# Patient Record
Sex: Male | Born: 1935 | Race: Black or African American | Hispanic: No | Marital: Married | Smoking: Current some day smoker
Health system: Southern US, Community
[De-identification: ages and names within clinical notes are randomized; demographics above are authoritative.]

## PROBLEM LIST (undated history)

## (undated) DIAGNOSIS — I10 Essential (primary) hypertension: Secondary | ICD-10-CM

## (undated) DIAGNOSIS — I2510 Atherosclerotic heart disease of native coronary artery without angina pectoris: Secondary | ICD-10-CM

## (undated) DIAGNOSIS — C221 Intrahepatic bile duct carcinoma: Secondary | ICD-10-CM

## (undated) DIAGNOSIS — E785 Hyperlipidemia, unspecified: Secondary | ICD-10-CM

## (undated) HISTORY — DX: Atherosclerotic heart disease of native coronary artery without angina pectoris: I25.10

## (undated) HISTORY — DX: Hyperlipidemia, unspecified: E78.5

## (undated) HISTORY — DX: Essential (primary) hypertension: I10

## (undated) HISTORY — PX: OTHER SURGICAL HISTORY: SHX169

---

## 1998-01-01 ENCOUNTER — Emergency Department (HOSPITAL_COMMUNITY): Admission: EM | Admit: 1998-01-01 | Discharge: 1998-01-01 | Payer: Self-pay | Admitting: Internal Medicine

## 1998-01-03 ENCOUNTER — Emergency Department (HOSPITAL_COMMUNITY): Admission: EM | Admit: 1998-01-03 | Discharge: 1998-01-03 | Payer: Self-pay | Admitting: Internal Medicine

## 1998-01-13 ENCOUNTER — Emergency Department (HOSPITAL_COMMUNITY): Admission: EM | Admit: 1998-01-13 | Discharge: 1998-01-13 | Payer: Self-pay | Admitting: Family Medicine

## 2001-07-21 ENCOUNTER — Encounter: Payer: Self-pay | Admitting: Emergency Medicine

## 2001-07-21 ENCOUNTER — Inpatient Hospital Stay (HOSPITAL_COMMUNITY): Admission: EM | Admit: 2001-07-21 | Discharge: 2001-07-24 | Payer: Self-pay | Admitting: Emergency Medicine

## 2001-08-10 ENCOUNTER — Encounter (HOSPITAL_COMMUNITY): Admission: RE | Admit: 2001-08-10 | Discharge: 2001-11-08 | Payer: Self-pay | Admitting: Interventional Cardiology

## 2013-04-25 ENCOUNTER — Other Ambulatory Visit: Payer: Self-pay | Admitting: *Deleted

## 2013-04-25 DIAGNOSIS — E785 Hyperlipidemia, unspecified: Secondary | ICD-10-CM

## 2013-05-11 ENCOUNTER — Encounter: Payer: Self-pay | Admitting: Cardiology

## 2013-05-11 DIAGNOSIS — E785 Hyperlipidemia, unspecified: Secondary | ICD-10-CM | POA: Insufficient documentation

## 2013-05-11 DIAGNOSIS — I2510 Atherosclerotic heart disease of native coronary artery without angina pectoris: Secondary | ICD-10-CM | POA: Insufficient documentation

## 2013-05-11 DIAGNOSIS — I10 Essential (primary) hypertension: Secondary | ICD-10-CM | POA: Insufficient documentation

## 2013-05-12 ENCOUNTER — Ambulatory Visit (INDEPENDENT_AMBULATORY_CARE_PROVIDER_SITE_OTHER): Payer: Medicare Other | Admitting: Interventional Cardiology

## 2013-05-12 ENCOUNTER — Telehealth: Payer: Self-pay

## 2013-05-12 ENCOUNTER — Encounter: Payer: Self-pay | Admitting: Interventional Cardiology

## 2013-05-12 ENCOUNTER — Other Ambulatory Visit: Payer: Medicare Other

## 2013-05-12 ENCOUNTER — Ambulatory Visit
Admission: RE | Admit: 2013-05-12 | Discharge: 2013-05-12 | Disposition: A | Discharge status: Home / Self Care | Payer: Medicare Other | Source: Ambulatory Visit | Attending: Interventional Cardiology | Admitting: Interventional Cardiology

## 2013-05-12 VITALS — BP 110/70 | HR 56 | Ht 73.0 in | Wt 167.4 lb

## 2013-05-12 DIAGNOSIS — R9389 Abnormal findings on diagnostic imaging of other specified body structures: Secondary | ICD-10-CM

## 2013-05-12 DIAGNOSIS — Z720 Tobacco use: Secondary | ICD-10-CM

## 2013-05-12 DIAGNOSIS — I2510 Atherosclerotic heart disease of native coronary artery without angina pectoris: Secondary | ICD-10-CM

## 2013-05-12 DIAGNOSIS — F17200 Nicotine dependence, unspecified, uncomplicated: Secondary | ICD-10-CM

## 2013-05-12 DIAGNOSIS — R634 Abnormal weight loss: Secondary | ICD-10-CM

## 2013-05-12 LAB — COMPREHENSIVE METABOLIC PANEL
ALT: 103 U/L — ABNORMAL HIGH (ref 0–53)
AST: 170 U/L — ABNORMAL HIGH (ref 0–37)
Albumin: 3 g/dL — ABNORMAL LOW (ref 3.5–5.2)
Alkaline Phosphatase: 591 U/L — ABNORMAL HIGH (ref 39–117)
BUN: 12 mg/dL (ref 6–23)
CO2: 30 mEq/L (ref 19–32)
Calcium: 9 mg/dL (ref 8.4–10.5)
Chloride: 101 mEq/L (ref 96–112)
Creatinine, Ser: 0.9 mg/dL (ref 0.4–1.5)
GFR: 109.72 mL/min (ref >60.00)
Glucose, Bld: 102 mg/dL — ABNORMAL HIGH (ref 70–99)
Potassium: 4.3 mEq/L (ref 3.5–5.1)
Sodium: 138 mEq/L (ref 135–145)
Total Bilirubin: 10.3 mg/dL — ABNORMAL HIGH (ref 0.3–1.2)
Total Protein: 7.7 g/dL (ref 6.0–8.3)

## 2013-05-12 LAB — CBC WITH DIFFERENTIAL/PLATELET
Basophils Absolute: 0 10*3/uL (ref 0.0–0.1)
Basophils Relative: 0.8 % (ref 0.0–3.0)
Eosinophils Absolute: 0.1 10*3/uL (ref 0.0–0.7)
Eosinophils Relative: 2.3 % (ref 0.0–5.0)
HCT: 49.1 % (ref 39.0–52.0)
Hemoglobin: 16.6 g/dL (ref 13.0–17.0)
Lymphocytes Relative: 20.2 % (ref 12.0–46.0)
Lymphs Abs: 1 10*3/uL (ref 0.7–4.0)
MCHC: 33.8 g/dL (ref 30.0–36.0)
MCV: 96.4 fl (ref 78.0–100.0)
Monocytes Absolute: 0.5 10*3/uL (ref 0.1–1.0)
Monocytes Relative: 10.1 % (ref 3.0–12.0)
Neutro Abs: 3.3 10*3/uL (ref 1.4–7.7)
Neutrophils Relative %: 66.6 % (ref 43.0–77.0)
Platelets: 216 10*3/uL (ref 150.0–400.0)
RBC: 5.1 Mil/uL (ref 4.22–5.81)
RDW: 17.3 % — ABNORMAL HIGH (ref 11.5–14.6)
WBC: 5 10*3/uL (ref 4.5–10.5)

## 2013-05-12 NOTE — Progress Notes (Signed)
Patient ID: Jason [name], male   DOB: 07/04/1934, 78 y.o.   MRN: [id number] Past Medical History  CAD with acute IMI 2003 treated with bare-metal stent RCA   Hypertension   Hyperlipidemia      [address] [place], [place]  [address] Phone: [phone] Fax:  [phone]  Date:  05/12/2013   ID:  Jason [name], DOB April 20, 1935, MRN [id number]  PCP:  No primary provider on file.   ASSESSMENT:  1. 20 pound weight loss in 12 months 2. Coronary artery disease, asymptomatic 3. Hypertension, controlled 4. Hyperlipidemia 5. Tobacco use  PLAN:  1. comprehensive metabolic panel and lipid 2. CBC 3. PA and lateral chest x-ray 4. Clinical followup in one year   SUBJECTIVE: Jason [name] is a 78 y.o. male who has no cardiovascular complaints. States his appetite is not as good as it used to be. He denied no weight loss but is obviously allergen him. He denies dyspnea. No palpitations or syncope. He denies dysphagia, hemoptysis, Hessie Diener, bright red blood per, abdominal pain, back pain, and other complaints.   Wt Readings from Last 3 Encounters:  05/12/13 167 lb 6.4 oz (75.932 kg)     Past Medical History  Diagnosis Date  . CAD (coronary artery disease)     , acute IMI  . Hypertension   . Hyperlipidemia     Current Outpatient Prescriptions  Medication Sig Dispense Refill  . aspirin 325 MG tablet Take 325 mg by mouth daily.      . nitroGLYCERIN (NITROSTAT) 0.4 MG SL tablet Place 0.4 mg under the tongue every 5 (five) minutes as needed for chest pain.      . ramipril (ALTACE) 5 MG capsule Take 5 mg by mouth daily.      . rosuvastatin (CRESTOR) 20 MG tablet Take 20 mg by mouth daily.      . sildenafil (VIAGRA) 50 MG tablet Take 50 mg by mouth daily as needed for erectile dysfunction.       No current facility-administered medications for this visit.    Allergies:   No Known Allergies  Social History:  The patient  reports that he has never smoked. He does not  have any smokeless tobacco history on file. He reports that he does not drink alcohol or use illicit drugs.   ROS:  Please see the history of present illness.   Decreased appetite. Continued cigarette smoking.   All other systems reviewed and negative.   OBJECTIVE: VS:  BP 110/70  Pulse 56  Ht 6\' 1"  (1.854 m)  Wt 167 lb 6.4 oz (75.932 kg)  BMI 22.09 kg/m2 Well nourished, well developed, in no acute distress, Malnourished appearing, and dramatic weight loss. HEENT: normal Neck: JVD flat. Carotid bruit Normal  Cardiac:  normal S1, S2; RRR; no murmur Lungs:  clear to auscultation bilaterally, no wheezing, rhonchi or rales Abd: soft, nontender, no hepatomegaly Ext: Edema none. Pulses none Skin: warm and dry Neuro:  CNs 2-12 intact, no focal abnormalities noted  EKG:  Old inferior myocardial infarction, sinus bradycardia, unchanged from prior.       Signed, Darci Needle III, MD 05/12/2013 9:44 AM

## 2013-05-12 NOTE — Telephone Encounter (Signed)
call received from Jason [name] at  [facility] regarding pt chest xray. results reviewed by Dr.Smith.pt to have Ct of the chest.pt notified of chest xray results and that pt will need a ct of the chest.pt instructed that scheduling will call to schedule ct.pt verbalized understanding

## 2013-05-12 NOTE — Patient Instructions (Signed)
Your physician recommends that you continue on your current medications as directed. Please refer to the Current Medication list given to you today.  Labs today: Lipid, CBC, Cmet  A chest x-ray takes a picture of the organs and structures inside the chest, including the heart, lungs, and blood vessels. This test can show several things, including, whether the heart is enlarges; whether fluid is building up in the lungs; and whether pacemaker / defibrillator leads are still in place.( To be done at [facility])  Your physician wants you to follow-up in: 1 year You will receive a reminder letter in the mail two months in advance. If you don't receive a letter, please call our office to schedule the follow-up appointment.

## 2013-05-13 ENCOUNTER — Telehealth: Payer: Self-pay

## 2013-05-13 NOTE — Telephone Encounter (Signed)
called pt to give appt date and time for CTA. pt sts that he already got the message appt12/18/14 @11  am no po 2 hrs prior

## 2013-05-15 ENCOUNTER — Encounter: Payer: Self-pay | Admitting: Interventional Cardiology

## 2013-05-16 ENCOUNTER — Other Ambulatory Visit: Payer: Self-pay | Admitting: Interventional Cardiology

## 2013-05-16 DIAGNOSIS — C3490 Malignant neoplasm of unspecified part of unspecified bronchus or lung: Secondary | ICD-10-CM

## 2013-05-16 DIAGNOSIS — F17200 Nicotine dependence, unspecified, uncomplicated: Secondary | ICD-10-CM

## 2013-05-19 ENCOUNTER — Ambulatory Visit (INDEPENDENT_AMBULATORY_CARE_PROVIDER_SITE_OTHER)
Admission: RE | Admit: 2013-05-19 | Discharge: 2013-05-19 | Disposition: A | Discharge status: Home / Self Care | Payer: Medicare Other | Source: Ambulatory Visit | Attending: Interventional Cardiology | Admitting: Interventional Cardiology

## 2013-05-19 ENCOUNTER — Other Ambulatory Visit: Payer: Medicare Other

## 2013-05-19 DIAGNOSIS — C801 Malignant (primary) neoplasm, unspecified: Secondary | ICD-10-CM

## 2013-05-19 DIAGNOSIS — F17200 Nicotine dependence, unspecified, uncomplicated: Secondary | ICD-10-CM

## 2013-05-19 DIAGNOSIS — C3490 Malignant neoplasm of unspecified part of unspecified bronchus or lung: Secondary | ICD-10-CM

## 2013-05-19 MED ORDER — IOHEXOL 300 MG/ML  SOLN
100.0000 mL | Freq: Once | INTRAMUSCULAR | Status: AC | PRN
  Administered 2013-05-19: 100 mL via INTRAVENOUS

## 2013-05-23 ENCOUNTER — Telehealth: Payer: Self-pay

## 2013-05-23 ENCOUNTER — Other Ambulatory Visit: Payer: Self-pay | Admitting: Interventional Cardiology

## 2013-05-23 DIAGNOSIS — C7800 Secondary malignant neoplasm of unspecified lung: Secondary | ICD-10-CM

## 2013-05-23 NOTE — Telephone Encounter (Signed)
pt given results of cta chest/abdomen by Dr.Smith.Needs an Oncology appointment ASAP with the first physician that can see him. pt will be referred to the cancer center

## 2013-05-24 ENCOUNTER — Telehealth: Payer: Self-pay | Admitting: Internal Medicine

## 2013-05-24 NOTE — Telephone Encounter (Signed)
C/D 05/24/13 for appt.05/31/13

## 2013-05-24 NOTE — Telephone Encounter (Signed)
S/W PT AND GVE NP APPT 12/30 @ 11 W/DR. MOHAMED REFERRING DR. [facility] WELCOME PACKET MAILED.

## 2013-05-31 ENCOUNTER — Ambulatory Visit (HOSPITAL_BASED_OUTPATIENT_CLINIC_OR_DEPARTMENT_OTHER): Payer: Medicare Other

## 2013-05-31 ENCOUNTER — Encounter: Payer: Self-pay | Admitting: Internal Medicine

## 2013-05-31 ENCOUNTER — Encounter (HOSPITAL_COMMUNITY): Payer: Self-pay | Admitting: Emergency Medicine

## 2013-05-31 ENCOUNTER — Inpatient Hospital Stay (HOSPITAL_COMMUNITY)
Admission: EM | Admit: 2013-05-31 | Discharge: 2013-06-03 | DRG: 843 | Disposition: A | Discharge status: Home / Self Care | Payer: Medicare Other | Attending: Internal Medicine | Admitting: Internal Medicine

## 2013-05-31 ENCOUNTER — Ambulatory Visit (HOSPITAL_BASED_OUTPATIENT_CLINIC_OR_DEPARTMENT_OTHER): Payer: Medicare Other | Admitting: Internal Medicine

## 2013-05-31 ENCOUNTER — Telehealth: Payer: Self-pay | Admitting: *Deleted

## 2013-05-31 ENCOUNTER — Ambulatory Visit: Payer: Medicare Other

## 2013-05-31 ENCOUNTER — Other Ambulatory Visit: Payer: Medicare Other

## 2013-05-31 VITALS — BP 123/81 | HR 80 | Temp 96.9°F | Resp 18 | Ht 71.0 in | Wt 168.2 lb

## 2013-05-31 DIAGNOSIS — E43 Unspecified severe protein-calorie malnutrition: Secondary | ICD-10-CM | POA: Diagnosis present

## 2013-05-31 DIAGNOSIS — C221 Intrahepatic bile duct carcinoma: Secondary | ICD-10-CM

## 2013-05-31 DIAGNOSIS — Z800 Family history of malignant neoplasm of digestive organs: Secondary | ICD-10-CM

## 2013-05-31 DIAGNOSIS — Z125 Encounter for screening for malignant neoplasm of prostate: Secondary | ICD-10-CM

## 2013-05-31 DIAGNOSIS — K769 Liver disease, unspecified: Secondary | ICD-10-CM

## 2013-05-31 DIAGNOSIS — C799 Secondary malignant neoplasm of unspecified site: Secondary | ICD-10-CM

## 2013-05-31 DIAGNOSIS — C801 Malignant (primary) neoplasm, unspecified: Principal | ICD-10-CM | POA: Diagnosis present

## 2013-05-31 DIAGNOSIS — M899 Disorder of bone, unspecified: Secondary | ICD-10-CM

## 2013-05-31 DIAGNOSIS — F17200 Nicotine dependence, unspecified, uncomplicated: Secondary | ICD-10-CM | POA: Diagnosis present

## 2013-05-31 DIAGNOSIS — Z7982 Long term (current) use of aspirin: Secondary | ICD-10-CM

## 2013-05-31 DIAGNOSIS — Z66 Do not resuscitate: Secondary | ICD-10-CM | POA: Diagnosis present

## 2013-05-31 DIAGNOSIS — C7951 Secondary malignant neoplasm of bone: Secondary | ICD-10-CM

## 2013-05-31 DIAGNOSIS — K7689 Other specified diseases of liver: Secondary | ICD-10-CM

## 2013-05-31 DIAGNOSIS — E785 Hyperlipidemia, unspecified: Secondary | ICD-10-CM | POA: Diagnosis present

## 2013-05-31 DIAGNOSIS — K831 Obstruction of bile duct: Secondary | ICD-10-CM | POA: Diagnosis present

## 2013-05-31 DIAGNOSIS — I10 Essential (primary) hypertension: Secondary | ICD-10-CM | POA: Diagnosis present

## 2013-05-31 DIAGNOSIS — I2510 Atherosclerotic heart disease of native coronary artery without angina pectoris: Secondary | ICD-10-CM | POA: Diagnosis present

## 2013-05-31 DIAGNOSIS — Q4479 Other congenital malformations of liver: Secondary | ICD-10-CM

## 2013-05-31 DIAGNOSIS — Z720 Tobacco use: Secondary | ICD-10-CM

## 2013-05-31 DIAGNOSIS — R17 Unspecified jaundice: Secondary | ICD-10-CM | POA: Diagnosis present

## 2013-05-31 DIAGNOSIS — M898X9 Other specified disorders of bone, unspecified site: Secondary | ICD-10-CM

## 2013-05-31 DIAGNOSIS — R195 Other fecal abnormalities: Secondary | ICD-10-CM | POA: Diagnosis present

## 2013-05-31 DIAGNOSIS — R634 Abnormal weight loss: Secondary | ICD-10-CM

## 2013-05-31 DIAGNOSIS — R180 Malignant ascites: Secondary | ICD-10-CM | POA: Diagnosis present

## 2013-05-31 DIAGNOSIS — Z79899 Other long term (current) drug therapy: Secondary | ICD-10-CM

## 2013-05-31 DIAGNOSIS — Z9861 Coronary angioplasty status: Secondary | ICD-10-CM

## 2013-05-31 DIAGNOSIS — Q441 Other congenital malformations of gallbladder: Secondary | ICD-10-CM

## 2013-05-31 DIAGNOSIS — R748 Abnormal levels of other serum enzymes: Secondary | ICD-10-CM | POA: Diagnosis present

## 2013-05-31 LAB — CBC WITH DIFFERENTIAL/PLATELET
BASO%: 0.8 % (ref 0.0–2.0)
Basophils Absolute: 0.1 10*3/uL (ref 0.0–0.1)
EOS%: 1.8 % (ref 0.0–7.0)
Eosinophils Absolute: 0.1 10*3/uL (ref 0.0–0.5)
HCT: 48.1 % (ref 38.4–49.9)
HGB: 16.9 g/dL (ref 13.0–17.1)
MCH: 32.4 pg (ref 27.2–33.4)
MCV: 92.3 fL (ref 79.3–98.0)
MONO%: 9.4 % (ref 0.0–14.0)
NEUT#: 4.4 10*3/uL (ref 1.5–6.5)
NEUT%: 73.1 % (ref 39.0–75.0)
RDW: 19.1 % — ABNORMAL HIGH (ref 11.0–14.6)
WBC: 6 10*3/uL (ref 4.0–10.3)
lymph#: 0.9 10*3/uL (ref 0.9–3.3)

## 2013-05-31 LAB — COMPREHENSIVE METABOLIC PANEL (CC13)
ALT: 132 U/L — ABNORMAL HIGH (ref 0–55)
AST: 231 U/L — ABNORMAL HIGH (ref 5–34)
Albumin: 2.2 g/dL — ABNORMAL LOW (ref 3.5–5.0)
Anion Gap: 14 mEq/L — ABNORMAL HIGH (ref 3–11)
BUN: 10.1 mg/dL (ref 7.0–26.0)
Calcium: 9.2 mg/dL (ref 8.4–10.4)
Chloride: 98 mEq/L (ref 98–109)
Creatinine: 0.8 mg/dL (ref 0.7–1.3)
Glucose: 106 mg/dl (ref 70–140)
Potassium: 4 mEq/L (ref 3.5–5.1)

## 2013-05-31 LAB — LACTATE DEHYDROGENASE (CC13): LDH: 320 U/L — ABNORMAL HIGH (ref 125–245)

## 2013-05-31 MED ORDER — SODIUM CHLORIDE 0.9 % IV SOLN
INTRAVENOUS | Status: DC
  Administered 2013-05-31: 20:00:00 via INTRAVENOUS
  Administered 2013-06-01: 125 mL/h via INTRAVENOUS

## 2013-05-31 NOTE — ED Notes (Signed)
Pt sts was seen today at cancer center and had blood work done and got call from Dr Arbutus Ped in the afternoon stating his lever enzymes were abnormal and that he needs to be seen in ED. Pt denies pain

## 2013-05-31 NOTE — Telephone Encounter (Signed)
appts made and printed...td 

## 2013-05-31 NOTE — Patient Instructions (Signed)
Referral to gastroenterology. Followup visit in 2 weeks

## 2013-05-31 NOTE — ED Provider Notes (Addendum)
CSN: [id number]     Arrival date & time 05/31/13  1753 History   First MD Initiated Contact with Patient 05/31/13 1840     Chief Complaint  Patient presents with  . Abnormal Lab   History is obtained through the patient and review of the medical record HPI Patient states he was sent to the emergency room because Dr. Shirline Frees and told him he needed to be admitted.  Patient has had trouble with decreased appetite and weight loss over the last several weeks. He had some blood tests done at his cardiologist's office. The patient does not really know the results of those blood tests and was not sure why he he was sent to Dr. Rebecca Eaton office.  He denies any trouble with chest pain or shortness of breath. He denies any trouble with fever vomiting or diarrhea.  He has noticed some mild itching.  Per Dr Rebecca Eaton notes "Jason [name] is a 78 y.o. male with a past medical history significant for hypertension, dyslipidemia, coronary artery disease status post stent placement as well as long history of smoking. The patient was seen recently by his cardiologist Dr. Katrinka Blazing for routine evaluation. He complains of lack of appetite and significant weight loss. CBC and comprehensive metabolic panel performed in his office on 05/12/2013 showed elevated liver enzyme with total bilirubin of 10.3, alkaline phosphatase 591, AST 170 and ALT 103. CT scan of the chest, abdomen and pelvis performed on 05/23/2013 showed right upper lobe scar like opacity containing a few punctate calcification measuring 5.8 x 1.3 x 1.5 CM. There is a left upper lobe anterior subpleural spiculated density likely representing scar. CT of the abdomen showed no focal liver abnormality and there was mild to moderate intrahepatic bile ductal dilatation with no common bile duct dilatation. There is no obstructing mass noted. The gallbladder appears collapsed and there was normal appearance of the pancreas. Further evaluation with ERCP of your MRCP was  recommended. There was also diffuse multifocal sclerotic bone lesions identified worrisome for metastatic disease.  The patient was not referred to gastroenterology but Dr. Katrinka Blazing referred him to the cancer Center for evaluation of his condition.  The initial plan was to see the patient back for followup visit in less than 2 weeks for evaluation and discussion of his treatment options based on the ERCP findings, but repeat CBC today showed significant deterioration of his hepatic function with increase in the total bilirubin to 21.13 and alkaline phosphatase up to 866, AST 31 and ALT 132.  I told the patient that time and recommended for him to go immediately to the emergency Department at [facility] for consideration of admission and initiating the gastrointestinal workup on an inpatient basis.  Past Medical History  Diagnosis Date  . CAD (coronary artery disease)     , acute IMI  . Hypertension   . Hyperlipidemia    History reviewed. No pertinent past surgical history. No family history on file. History  Substance Use Topics  . Smoking status: Current Some Day Smoker  . Smokeless tobacco: Not on file  . Alcohol Use: No    Review of Systems  All other systems reviewed and are negative.    Allergies  Review of patient's allergies indicates no known allergies.  Home Medications   Current Outpatient Rx  Name  Route  Sig  Dispense  Refill  . aspirin 325 MG tablet   Oral   Take 325 mg by mouth daily.         Marland Kitchen  latanoprost (XALATAN) 0.005 % ophthalmic solution   Both Eyes   Place 1 drop into both eyes at bedtime.          . nitroGLYCERIN (NITROSTAT) 0.4 MG SL tablet   Sublingual   Place 0.4 mg under the tongue every 5 (five) minutes as needed for chest pain.         . ramipril (ALTACE) 5 MG capsule   Oral   Take 5 mg by mouth daily.         . rosuvastatin (CRESTOR) 20 MG tablet   Oral   Take 20 mg by mouth daily.         . sildenafil (VIAGRA) 50 MG  tablet   Oral   Take 50 mg by mouth daily as needed for erectile dysfunction.         . timolol (TIMOPTIC) 0.5 % ophthalmic solution   Both Eyes   Place 1 drop into both eyes daily.           BP 124/76  Pulse 103  Temp(Src) 97.1 F (36.2 C) (Oral)  Resp 18  SpO2 95% Physical Exam  Nursing note and vitals reviewed. Constitutional: No distress.  HENT:  Head: Normocephalic and atraumatic.  Right Ear: External ear normal.  Left Ear: External ear normal.  Mouth/Throat: No oropharyngeal exudate.  Eyes: Conjunctivae are normal. Right eye exhibits no discharge. Left eye exhibits no discharge. Scleral icterus is present.  Neck: Neck supple. No tracheal deviation present.  Cardiovascular: Normal rate, regular rhythm and intact distal pulses.   Pulmonary/Chest: Effort normal and breath sounds normal. No stridor. No respiratory distress. He has no wheezes. He has no rales.  Smells of cigarette smoke   Abdominal: Soft. Bowel sounds are normal. He exhibits no distension. There is no tenderness. There is no rebound and no guarding.  Musculoskeletal: He exhibits no edema and no tenderness.  Neurological: He is alert. He has normal strength. No sensory deficit. Cranial nerve deficit:  no gross defecits noted. He exhibits normal muscle tone. He displays no seizure activity. Coordination normal.  Skin: Skin is warm and dry. No rash noted. He is not diaphoretic.  Psychiatric: He has a normal mood and affect.    ED Course  Procedures (including critical care time) Labs Review Labs Reviewed - No data to display Imaging Review No results found.  EKG Interpretation   None       MDM   1. Hyperbilirubinemia   2. Weight loss   3. Malignancy     [name] symptoms are concerning for some type of malignancy. He does have increasing hyperbilirubinemia with dilated biliary ducts. Patient is medically stable emergency room. I will consult the medical service and the gastroenterology  service to arrange for admission as requested by Dr. Burman Riis, MD 05/31/13 [phone]  Discussed with Dr Marina Goodell.  GI team will consult on patient.  Probably would benefit from MRCP.  Celene Kras, MD 05/31/13 [phone]

## 2013-05-31 NOTE — ED Notes (Signed)
Pt denies n/v/d, fever or itching. Pt's eyes yellow

## 2013-05-31 NOTE — H&P (Signed)
[facility] History and Physical  Jason [name] ZOX:[id number] DOB: 1935/01/25 DOA: 05/31/2013  Referring physician: Linwood Dibbles, MD PCP: [facility]  Chief Complaint: Cough, weight loss, and jaundice  HPI: Jason [name] is a 78 y.o. male   The patient has medical problems that include hypertension, dyslipidemia on statin, coronary artery disease status post PCI, and tobacco abuse.  He describes symptoms that include weight loss over approximately 3 years, approximately 20-40 pounds but difficult to quantify, he says.  He initially intended to lose some extra weight by limiting his portions but then continued to lose weight unintentionally.  He does complain of lack of appetite and some decrease in PO intake.  He endorses darkened urine, which now appears cola colored.  Additionally, he endorses some lightening/acholic stools over several months. The patient denies fevers or chills.  He has a chronic cough which is productive of scant mucoid sputum.  He describes his functional capacity as good, being able to walk 1 mile without fatiguing.  He lives at home with his wife and an older family member.  The patient was recently seen by Dr. Katrinka Blazing for general evaluation.  Presumably after that visit it was discovered that he had hyperbilirubinemia to 10.3, ALP 591, AST 170, and ALT 103.  He underwent CT scan of the chest that revealed RUL scar-like opacitifcation 5.8 x 1.3 x 1.5 cm.  There was mild-mod intrahepatic biliary ductal diltation but no CBD dil.  Additionally there was mention of bony lesions possibly consistent with metastatic disease.  He was evaluated by Dr. Si Gaul   Review of Systems:  Constitutional:  No weight loss, night sweats, Fevers, chills, fatigue.  He does endorse being hot at night which is no different from baseline. HEENT:  No headaches, Difficulty swallowing,Tooth/dental problems,Sore throat,  No sneezing, itching, ear ache, nasal congestion,  post nasal drip,  Cardio-vascular:  No chest pain, Orthopnea, PND, swelling in lower extremities, anasarca, dizziness, palpitations  GI:  No heartburn, indigestion, abdominal pain, nausea, vomiting, diarrhea,  Endorses loss of appetite, acholic stools, darkened urine Resp:  No shortness of breath with exertion or at rest. No excess mucus, No coughing up of blood.No change in color of mucus.No wheezing.No chest wall deformity  +Cough prod mucoid sputum, no change Skin:  no rash or lesions.  GU:  no dysuria,  no urgency or frequency. No flank pain.  +Darkened urine Musculoskeletal:  No joint pain or swelling. No decreased range of motion. No back pain.  Psych:  No change in mood or affect. No depression or anxiety. No memory loss.  The remainder of a complete review of systems was reviewed and was negative.  Past Medical History  Diagnosis Date  . CAD (coronary artery disease)     , acute IMI  . Hypertension   . Hyperlipidemia    History reviewed. No pertinent past surgical history. Social History:  reports that he has been smoking Cigarettes.  He has a 30 pack-year smoking history. He does not have any smokeless tobacco history on file. He reports that he does not drink alcohol or use illicit drugs.  Endorses smoking 1 pack per 2 days; started smoking aged 78 or so which leads to approx 30 pack-yr history  No Known Allergies  Family History  Problem Relation Age of Onset  . Stomach cancer Brother     Prior to Admission medications   Medication Sig Start Date End Date Taking? Authorizing Provider  aspirin 325 MG tablet  Take 325 mg by mouth daily.   Yes Historical Provider, MD  latanoprost (XALATAN) 0.005 % ophthalmic solution Place 1 drop into both eyes at bedtime.  05/23/13  Yes Historical Provider, MD  nitroGLYCERIN (NITROSTAT) 0.4 MG SL tablet Place 0.4 mg under the tongue every 5 (five) minutes as needed for chest pain.   Yes Historical Provider, MD  ramipril (ALTACE) 5  MG capsule Take 5 mg by mouth daily.   Yes Historical Provider, MD  rosuvastatin (CRESTOR) 20 MG tablet Take 20 mg by mouth daily.   Yes Historical Provider, MD  sildenafil (VIAGRA) 50 MG tablet Take 50 mg by mouth daily as needed for erectile dysfunction.   Yes Historical Provider, MD  timolol (TIMOPTIC) 0.5 % ophthalmic solution Place 1 drop into both eyes daily.  05/23/13  Yes Historical Provider, MD   Physical Exam: Filed Vitals:   05/31/13 1810  BP: 124/76  Pulse: 103  Temp: 97.1 F (36.2 C)  Resp: 18    BP 124/76  Pulse 103  Temp(Src) 97.1 F (36.2 C) (Oral)  Resp 18  SpO2 95%  BP 124/76  Pulse 103  Temp(Src) 97.1 F (36.2 C) (Oral)  Resp 18  SpO2 95%  General Appearance:    Alert, cooperative, no distress, appears stated age  Head:    Normocephalic, without obvious abnormality, atraumatic  Eyes:    PERRL, conjunctiva/corneas clear, EOM's intact, fundi    benign, both eyes  ; prominent scleral icterus   Nose:   Nares normal, septum midline, mucosa normal, no drainage    or sinus tenderness  Throat:   Lips, mucosa, and tongue normal; teeth and gums normal; +palatal icterus  Neck:   Supple, symmetrical, trachea midline, no adenopathy;       thyroid:  No enlargement/tenderness/nodules; no carotid   bruit or JVD  Back:     Symmetric, no curvature, ROM normal, no CVA tenderness  Lungs:     Clear to auscultation bilaterally except for some expiratory monophonic wheezing over the RUL, respirations unlabored  Chest wall:    No tenderness or deformity  Heart:    Regular rate and rhythm, S1 and S2 normal, no murmur, rub   or gallop  Abdomen:     Soft, non-tender, bowel sounds active    no masses, +hepatic enlargement, liver span about 18 cm vertically  Extremities:   Extremities normal, atraumatic, no cyanosis or edema  Pulses:   2+ and symmetric all extremities  Skin:   Skin color jaundiced, texture, turgor normal, no rashes or lesions  Lymph nodes:   Cervical,  supraclavicular, and axillary nodes normal            Labs on Admission:  Basic Metabolic Panel:  Recent Labs Lab 05/31/13 1306  NA 136  K 4.0  CO2 24  GLUCOSE 106  BUN 10.1  CREATININE 0.8  CALCIUM 9.2   Liver Function Tests:  Recent Labs Lab 05/31/13 1306  AST 231*  ALT 132*  ALKPHOS 866*  BILITOT 21.13*  PROT 7.6  ALBUMIN 2.2*   No results found for this basename: LIPASE, AMYLASE,  in the last 168 hours No results found for this basename: AMMONIA,  in the last 168 hours CBC:  Recent Labs Lab 05/31/13 1305  WBC 6.0  NEUTROABS 4.4  HGB 16.9  HCT 48.1  MCV 92.3  PLT 188   Cardiac Enzymes: No results found for this basename: CKTOTAL, CKMB, CKMBINDEX, TROPONINI,  in the last 168 hours  BNP (last  3 results) No results found for this basename: PROBNP,  in the last 8760 hours CBG: No results found for this basename: GLUCAP,  in the last 168 hours  Radiological Exams on Admission: No results found.  Assessment/Plan Active Problems:   Hyperbilirubinemia   1. Hyperbilirubinemia, obstructive jaundice.  Given the CT findings of intrahepatic biliary ductal dilatation, although there is no biliary outflow obstruction definitely identified by CT, with RUL mass, there is concern for lung CA which is metastatic.  Of course, hepatobiliary primary could be considered such as cholangioCA, Kl[place] tumor, gall bladder carcinoma, etc.   The patient will need GI consultation in the AM.  I will order MRCP to further evaluate. 2. RUL lung mass.  CT scan shows this mass to be a "nonspecific area of scar-like consolidation" which is thought to be sequelae of chronic inflammation/infection but given this patient's smoking history, a primary lung CA is also considered.  The patient may need biopsy of this mass, pending workup of biliary outflow obstruction. 3. Hypertension: continue ACE inhibitor at home dose 4. Dyslipidemia and coronary artery disease: substitute formulary statin  atorva 40 mg daily 5. Tobacco abuse plays into diagnostic considerations above; counseled in cessation.  Code Status: DNR: patient with decision making capacity declines resuscitation if he experiences CP arrest Family Communication: Jason [name], wife at bedside, discussed plan of care and questions were answered, per chart [phone] Disposition Plan: inpatient 3-4 days Time spent: 55 minutes  Maxie Barb [facility] Pager [phone]

## 2013-05-31 NOTE — Progress Notes (Signed)
[facility] [phone]   [phone]  CONSULT NOTE  REFERRING PHYSICIAN: Dr. Verdis Prime  REASON FOR CONSULTATION:  78 years old African American male with questionable metastatic cancer  HPI Jason [name] is a 78 y.o. male with a past medical history significant for hypertension, dyslipidemia, coronary artery disease status post stent placement as well as long history of smoking. The patient was seen recently by his cardiologist Dr. Katrinka Blazing for routine evaluation. He complains of lack of appetite and significant weight loss. CBC and comprehensive metabolic panel performed in his office on 05/12/2013 showed elevated liver enzyme with total bilirubin of 10.3, alkaline phosphatase 591, AST 170 and ALT 103. CT scan of the chest, abdomen and pelvis performed on 05/23/2013 showed right upper lobe scar like opacity containing a few punctate calcification measuring 5.8 x 1.3 x 1.5 CM. There is a left upper lobe anterior subpleural spiculated density likely representing scar. CT of the abdomen showed no focal liver abnormality and there was mild to moderate intrahepatic bile ductal dilatation with no common bile duct dilatation. There is no obstructing mass noted. The gallbladder appears collapsed and there was normal appearance of the pancreas. Further evaluation with ERCP of your MRCP was recommended. There was also diffuse multifocal sclerotic bone lesions identified worrisome for metastatic disease. The patient was not referred to gastroenterology but Dr. Katrinka Blazing referred him to the cancer Center for evaluation of his condition. When seen today the patient is feeling fine with no specific complaints. He denied having any significant nausea or vomiting, no abdominal pain, no change in the bowel movement. He denied having any significant chest pain, shortness breath, cough or hemoptysis. He lost around 20 pounds over the last 3 years. The patient looked jaundice but he did  not notice the change in his eye color. He denied having any significant headache or blurry vision. His family history significant for father with ERCP and brother with stomach cancer. The patient is married and has 2 children. He is currently retired and did a lot of of maintenance work. He has a history of smoking around one pack per day for 61 years and unfortunately he continues to smoke. He drinks alcohol occasionally and no history of drug abuse.  HPI  Past Medical History  Diagnosis Date  . CAD (coronary artery disease)     , acute IMI  . Hypertension   . Hyperlipidemia     History reviewed. No pertinent past surgical history.  History reviewed. No pertinent family history.  Social History History  Substance Use Topics  . Smoking status: Current Some Day Smoker  . Smokeless tobacco: Not on file  . Alcohol Use: No    No Known Allergies  Current Outpatient Prescriptions  Medication Sig Dispense Refill  . aspirin 325 MG tablet Take 325 mg by mouth daily.      Marland Kitchen latanoprost (XALATAN) 0.005 % ophthalmic solution       . nitroGLYCERIN (NITROSTAT) 0.4 MG SL tablet Place 0.4 mg under the tongue every 5 (five) minutes as needed for chest pain.      . ramipril (ALTACE) 5 MG capsule Take 5 mg by mouth daily.      . rosuvastatin (CRESTOR) 20 MG tablet Take 20 mg by mouth daily.      . sildenafil (VIAGRA) 50 MG tablet Take 50 mg by mouth daily as needed for erectile dysfunction.      . timolol (TIMOPTIC) 0.5 % ophthalmic solution  No current facility-administered medications for this visit.    Review of Systems  Constitutional: negative Eyes: positive for Jaundiced Ears, nose, mouth, throat, and face: negative Respiratory: negative Cardiovascular: negative Gastrointestinal: negative Genitourinary:negative Integument/breast: negative Hematologic/lymphatic: negative Musculoskeletal:negative Neurological: negative Behavioral/Psych: negative Endocrine:  negative Allergic/Immunologic: negative  Physical Exam  GNF:AOZHY, healthy, no distress, well nourished and well developed SKIN: skin color, texture, turgor are normal, no rashes or significant lesions HEAD: Normocephalic, No masses, lesions, tenderness or abnormalities EYES: Icteric sclera EARS: External ears normal, Canals clear OROPHARYNX:no exudate, no erythema and lips, buccal mucosa, and tongue normal  NECK: supple, no adenopathy, no JVD LYMPH:  no palpable lymphadenopathy, no hepatosplenomegaly LUNGS: clear to auscultation , and palpation HEART: regular rate & rhythm, no murmurs and no gallops ABDOMEN:abdomen soft, non-tender, normal bowel sounds and no masses or organomegaly BACK: Back symmetric, no curvature., No CVA tenderness EXTREMITIES:no joint deformities, effusion, or inflammation, no edema, no skin discoloration, no clubbing  NEURO: alert & oriented x 3 with fluent speech, no focal motor/sensory deficits  PERFORMANCE STATUS: ECOG 1  LABORATORY DATA: Lab Results  Component Value Date   WBC 5.0 05/12/2013   HGB 16.6 05/12/2013   HCT 49.1 05/12/2013   MCV 96.4 05/12/2013   PLT 216.0 05/12/2013      Chemistry      Component Value Date/Time   NA 138 05/12/2013 1004   K 4.3 05/12/2013 1004   CL 101 05/12/2013 1004   CO2 30 05/12/2013 1004   BUN 12 05/12/2013 1004   CREATININE 0.9 05/12/2013 1004      Component Value Date/Time   CALCIUM 9.0 05/12/2013 1004   ALKPHOS 591* 05/12/2013 1004   AST 170* 05/12/2013 1004   ALT 103* 05/12/2013 1004   BILITOT 10.3* 05/12/2013 1004       RADIOGRAPHIC STUDIES: Dg Chest 2 View  05/12/2013   CLINICAL DATA:  Tobacco use and hypertension.  EXAM: CHEST  2 VIEW  COMPARISON:  None.  FINDINGS: The cardiomediastinal silhouette is within normal limits. The lungs are mildly hyperinflated. Interstitial markings are mildly prominent diffusely. There is an approximately 1 cm elongated focal opacity in the right upper lobe.  Patch, somewhat ill-defined opacities are also present in the left upper lobe and right lower lobe. There is no evidence of pleural effusion or pneumothorax. Multiple old left-sided rib fractures are present.  IMPRESSION: Hyperinflation with scattered, ill-defined patchy opacities in both upper lobes and right lower lobe. While these could reflect the sequelae of current or past infection, given patient's smoking history underlying malignancy is not excluded. If available, comparison with any prior chest radiographs would be helpful to assess stability. Otherwise, consider chest CT for further evaluation.   Electronically Signed   By: Sebastian Ache   On: 05/12/2013 14:51   Ct Chest W Contrast  05/19/2013   CLINICAL DATA:  Evaluate for metastatic disease.  EXAM: CT CHEST, ABDOMEN, AND PELVIS WITH CONTRAST  TECHNIQUE: Multidetector CT imaging of the chest, abdomen and pelvis was performed following the standard protocol during bolus administration of intravenous contrast.  CONTRAST:  OMNIPAQUE IOHEXOL 300 MG/ML  SOLN  COMPARISON:  None.  FINDINGS: CT CHEST FINDINGS  No pleural effusion identified. Advanced changes of centrilobular and paraseptal emphysema identified. Right upper lobe scar like opacity containing a few punctate calcifications is noted measuring 5.8 x 1.3 by 1.5 cm, image 18/series 4. There is a left upper lobe anterior subpleural spiculated density which likely represents scar, image 15/ series 4.  The trachea os stress set the trachea appears patent and is midline.  The heart size is normal.  No pericardial effusion.  There is no enlarged mediastinal or hilar lymph nodes identified.  Coronary artery calcifications are identified including RCA, LAD and left circumflex arteries. Mild calcified atherosclerotic change also involves the thoracic aorta. No enlarged axillary or supraclavicular adenopathy.  Review of the visualized osseous structures demonstrates innumerable sclerotic lesions  throughout the bony thorax. Most of these measure less than 1 cm and are worrisome for bone metastases.  CT ABDOMEN AND PELVIS FINDINGS  There is no focal liver abnormality. There is a mild to moderate intrahepatic bile duct dilatation. No common bile duct dilatation. No obstructing mass noted. Gallbladder appears collapsed. Common bile duct has a normal caliber. Normal appearance of the pancreas. The spleen is negative.  The adrenal glands both appear normal. Both kidneys are on unremarkable.  The urinary bladder appears normal. Prostate gland is enlarged and has a transverse dimension of 5.9 cm.  Normal caliber of the abdominal aorta. There is no upper abdominal adenopathy noted. No pelvic or inguinal adenopathy noted. Moderate ascites is identified within the abdomen and pelvis.  Review of the visualized osseous structures is significant for diffuse sclerotic bone metastasis. No pathologic fractures identified.  IMPRESSION: CT chest:  1. Nonspecific area of scar-like consolidation is noted in the right upper lobe. This is favored to be the sequela of chronic infection or inflammation. 2. Diffuse multifocal sclerotic bone lesions are identified worrisome for metastatic disease. CT abdomen and pelvis:  1. Intrahepatic bile duct dilatation with normal caliber common bile duct. In a patient who has elevated liver function tests I cannot exclude centrally obstructing lesion. Consider further evaluation with ERCP or MRCP. 2. Abdominal and pelvic ascites 3. Diffuse sclerotic bone lesions identified worrisome for widespread metastatic disease.   Electronically Signed   By: Signa Kell M.D.   On: 05/19/2013 14:11   Ct Abdomen Pelvis W Contrast  05/19/2013   CLINICAL DATA:  Evaluate for metastatic disease.  EXAM: CT CHEST, ABDOMEN, AND PELVIS WITH CONTRAST  TECHNIQUE: Multidetector CT imaging of the chest, abdomen and pelvis was performed following the standard protocol during bolus administration of intravenous  contrast.  CONTRAST:  OMNIPAQUE IOHEXOL 300 MG/ML  SOLN  COMPARISON:  None.  FINDINGS: CT CHEST FINDINGS  No pleural effusion identified. Advanced changes of centrilobular and paraseptal emphysema identified. Right upper lobe scar like opacity containing a few punctate calcifications is noted measuring 5.8 x 1.3 by 1.5 cm, image 18/series 4. There is a left upper lobe anterior subpleural spiculated density which likely represents scar, image 15/ series 4.  The trachea os stress set the trachea appears patent and is midline.  The heart size is normal.  No pericardial effusion.  There is no enlarged mediastinal or hilar lymph nodes identified.  Coronary artery calcifications are identified including RCA, LAD and left circumflex arteries. Mild calcified atherosclerotic change also involves the thoracic aorta. No enlarged axillary or supraclavicular adenopathy.  Review of the visualized osseous structures demonstrates innumerable sclerotic lesions throughout the bony thorax. Most of these measure less than 1 cm and are worrisome for bone metastases.  CT ABDOMEN AND PELVIS FINDINGS  There is no focal liver abnormality. There is a mild to moderate intrahepatic bile duct dilatation. No common bile duct dilatation. No obstructing mass noted. Gallbladder appears collapsed. Common bile duct has a normal caliber. Normal appearance of the pancreas. The spleen is negative.  The adrenal  glands both appear normal. Both kidneys are on unremarkable.  The urinary bladder appears normal. Prostate gland is enlarged and has a transverse dimension of 5.9 cm.  Normal caliber of the abdominal aorta. There is no upper abdominal adenopathy noted. No pelvic or inguinal adenopathy noted. Moderate ascites is identified within the abdomen and pelvis.  Review of the visualized osseous structures is significant for diffuse sclerotic bone metastasis. No pathologic fractures identified.  IMPRESSION: CT chest:  1. Nonspecific area of scar-like  consolidation is noted in the right upper lobe. This is favored to be the sequela of chronic infection or inflammation. 2. Diffuse multifocal sclerotic bone lesions are identified worrisome for metastatic disease. CT abdomen and pelvis:  1. Intrahepatic bile duct dilatation with normal caliber common bile duct. In a patient who has elevated liver function tests I cannot exclude centrally obstructing lesion. Consider further evaluation with ERCP or MRCP. 2. Abdominal and pelvic ascites 3. Diffuse sclerotic bone lesions identified worrisome for widespread metastatic disease.   Electronically Signed   By: Signa Kell M.D.   On: 05/19/2013 14:11    ASSESSMENT: This is a very pleasant 78 years old [place] American male with significantly elevated liver enzyme questionable for biliary obstruction of unknown etiology but could be suspicious for cholangiocarcinoma or other hepatobiliary malignancy. The patient also has diffuse sclerotic bone lesions suspicious for widespread metastases.   PLAN: I have a lengthy discussion with the patient today about his condition. I ordered several studies today to evaluate his condition including repeat CBC, comprehensive metabolic panel, LDH, alpha-fetoprotein, CA 19-9. I also made the start referral to gastroenterology for evaluation of his condition and consideration of ERCP. The initial plan was to see the patient back for followup visit in less than 2 weeks for evaluation and discussion of his treatment options based on the ERCP findings, but repeat CBC today showed significant deterioration of his hepatic function with increase in the total bilirubin to 21.13 and alkaline phosphatase up to 866, AST 31 and ALT 132. I told the patient that time and recommended for him to go immediately to the emergency Department at [facility] for consideration of admission and initiating the gastrointestinal workup on an inpatient basis. I will follow the patient on during his  hospitalization and give recommendation as needed. The patient agreed to the current plan. The patient voices understanding of current disease status and treatment options and is in agreement with the current care plan.  All questions were answered. The patient knows to call the clinic with any problems, questions or concerns. We can certainly see the patient much sooner if necessary.  Thank you so much for allowing me to participate in the care of Jason [name]. I will continue to follow up the patient with you and assist in his care.  I spent 40 minutes counseling the patient face to face. The total time spent in the appointment was 60 minutes.  Cyndie Woodbeck K. 05/31/2013, 12:38 PM

## 2013-06-01 ENCOUNTER — Inpatient Hospital Stay (HOSPITAL_COMMUNITY): Payer: Medicare Other

## 2013-06-01 ENCOUNTER — Inpatient Hospital Stay (HOSPITAL_COMMUNITY)
Admit: 2013-06-01 | Discharge: 2013-06-01 | Disposition: A | Discharge status: Home / Self Care | Payer: Medicare Other | Attending: Internal Medicine | Admitting: Internal Medicine

## 2013-06-01 ENCOUNTER — Encounter (HOSPITAL_COMMUNITY): Payer: Self-pay | Admitting: Radiology

## 2013-06-01 DIAGNOSIS — E785 Hyperlipidemia, unspecified: Secondary | ICD-10-CM

## 2013-06-01 DIAGNOSIS — C801 Malignant (primary) neoplasm, unspecified: Principal | ICD-10-CM | POA: Diagnosis present

## 2013-06-01 DIAGNOSIS — E43 Unspecified severe protein-calorie malnutrition: Secondary | ICD-10-CM

## 2013-06-01 DIAGNOSIS — I10 Essential (primary) hypertension: Secondary | ICD-10-CM

## 2013-06-01 DIAGNOSIS — R748 Abnormal levels of other serum enzymes: Secondary | ICD-10-CM

## 2013-06-01 DIAGNOSIS — R17 Unspecified jaundice: Secondary | ICD-10-CM

## 2013-06-01 DIAGNOSIS — K831 Obstruction of bile duct: Secondary | ICD-10-CM

## 2013-06-01 LAB — CBC
HCT: 41.9 % (ref 39.0–52.0)
Hemoglobin: 14.8 g/dL (ref 13.0–17.0)
MCH: 32.7 pg (ref 26.0–34.0)
Platelets: 189 10*3/uL (ref 150–400)
RBC: 4.52 MIL/uL (ref 4.22–5.81)
WBC: 6.2 10*3/uL (ref 4.0–10.5)

## 2013-06-01 LAB — LIPASE, BLOOD: Lipase: 36 U/L (ref 11–59)

## 2013-06-01 LAB — COMPREHENSIVE METABOLIC PANEL
ALT: 105 U/L — ABNORMAL HIGH (ref 0–53)
AST: 196 U/L — ABNORMAL HIGH (ref 0–37)
Albumin: 1.9 g/dL — ABNORMAL LOW (ref 3.5–5.2)
Alkaline Phosphatase: 702 U/L — ABNORMAL HIGH (ref 39–117)
BUN: 10 mg/dL (ref 6–23)
Calcium: 8.5 mg/dL (ref 8.4–10.5)
GFR calc Af Amer: >90 mL/min (ref >90)
Potassium: 3.6 mEq/L — ABNORMAL LOW (ref 3.7–5.3)
Sodium: 136 mEq/L — ABNORMAL LOW (ref 137–147)
Total Protein: 5.9 g/dL — ABNORMAL LOW (ref 6.0–8.3)

## 2013-06-01 LAB — PROTIME-INR: Prothrombin Time: 13.9 seconds (ref 11.6–15.2)

## 2013-06-01 LAB — AFP TUMOR MARKER: AFP-Tumor Marker: 3.2 ng/mL (ref 0.0–8.0)

## 2013-06-01 LAB — CANCER ANTIGEN 19-9: CA 19-9: 28035.3 U/mL — ABNORMAL HIGH (ref <35.0)

## 2013-06-01 MED ORDER — ATORVASTATIN CALCIUM 40 MG PO TABS
40.0000 mg | ORAL_TABLET | Freq: Every day | ORAL | Status: DC
  Administered 2013-06-01 – 2013-06-02 (×2): 40 mg via ORAL
  Filled 2013-06-01 (×4): qty 1

## 2013-06-01 MED ORDER — ACETAMINOPHEN 325 MG PO TABS: 650.0000 mg | ORAL_TABLET | Freq: Four times a day (QID) | ORAL | Status: DC | PRN

## 2013-06-01 MED ORDER — LATANOPROST 0.005 % OP SOLN
1.0000 [drp] | Freq: Every day | OPHTHALMIC | Status: DC
  Administered 2013-06-01 – 2013-06-02 (×3): 1 [drp] via OPHTHALMIC
  Filled 2013-06-01 (×2): qty 2.5

## 2013-06-01 MED ORDER — TIMOLOL MALEATE 0.5 % OP SOLN
1.0000 [drp] | Freq: Every day | OPHTHALMIC | Status: DC
  Administered 2013-06-01 – 2013-06-03 (×3): 1 [drp] via OPHTHALMIC
  Filled 2013-06-01: qty 5

## 2013-06-01 MED ORDER — SODIUM CHLORIDE 0.9 % IV SOLN
INTRAVENOUS | Status: AC
  Administered 2013-06-01: 02:00:00 via INTRAVENOUS

## 2013-06-01 MED ORDER — ENOXAPARIN SODIUM 40 MG/0.4ML ~~LOC~~ SOLN
40.0000 mg | Freq: Every day | SUBCUTANEOUS | Status: DC
  Administered 2013-06-01 – 2013-06-02 (×3): 40 mg via SUBCUTANEOUS
  Filled 2013-06-01 (×5): qty 0.4

## 2013-06-01 MED ORDER — ASPIRIN 325 MG PO TABS
325.0000 mg | ORAL_TABLET | Freq: Every day | ORAL | Status: DC
  Administered 2013-06-01 – 2013-06-03 (×3): 325 mg via ORAL
  Filled 2013-06-01 (×4): qty 1

## 2013-06-01 MED ORDER — ONDANSETRON HCL 4 MG/2ML IJ SOLN: 4.0000 mg | Freq: Four times a day (QID) | INTRAMUSCULAR | Status: DC | PRN

## 2013-06-01 MED ORDER — ACETAMINOPHEN 650 MG RE SUPP: 650.0000 mg | Freq: Four times a day (QID) | RECTAL | Status: DC | PRN

## 2013-06-01 MED ORDER — NITROGLYCERIN 0.4 MG SL SUBL: 0.4000 mg | SUBLINGUAL_TABLET | SUBLINGUAL | Status: DC | PRN

## 2013-06-01 MED ORDER — ONDANSETRON HCL 4 MG PO TABS: 4.0000 mg | ORAL_TABLET | Freq: Four times a day (QID) | ORAL | Status: DC | PRN

## 2013-06-01 MED ORDER — GADOBENATE DIMEGLUMINE 529 MG/ML IV SOLN
15.0000 mL | Freq: Once | INTRAVENOUS | Status: AC | PRN
  Administered 2013-06-01: 15 mL via INTRAVENOUS

## 2013-06-01 MED ORDER — RAMIPRIL 5 MG PO CAPS
5.0000 mg | ORAL_CAPSULE | Freq: Every day | ORAL | Status: DC
  Administered 2013-06-01 – 2013-06-03 (×3): 5 mg via ORAL
  Filled 2013-06-01 (×4): qty 1

## 2013-06-01 NOTE — Consult Note (Addendum)
Consultation  Referring Provider:   [facility]   Primary Care Physician:  None Primary Gastroenterologist: None      Reason for Consultation:  Jaundice         HPI:   Jason [name] is a 78 y.o. male who has been undergoing workup for metastatic cancer which appears to be at this point from an unknown primary. CTscan 12/18 revealed mild to moderate intrahepatic duct dilation and diffuse sclerotic bone mets. Patient seen for initial Oncology visit yesterday.  Because of worsening LFTs patient was sent by Oncologist to ED . MRCP has been done and raises suspicion for cholangiocarcinoma. There is also abnormal enhancement along CBD. No pancreatic masses seen. There has been interval progression of ascites. CA 19-9 is 28,000. Patient has had significant weight loss. No abdominal pain. No chills. + icterus, acholic stools and dark urine. + anorexia. No pruritris.  Past Medical History  Diagnosis Date  . CAD (coronary artery disease)     , acute IMI  . Hypertension   . Hyperlipidemia     Past Surgical History  Procedure Laterality Date  . Heart stint    . Cataracts      Family History  Problem Relation Age of Onset  . Stomach cancer Brother    No colon cancer. No liver disease / cancer  History  Substance Use Topics  . Smoking status: Current Some Day Smoker -- 0.50 packs/day for 60 years    Types: Cigarettes  . Smokeless tobacco: Never Used  . Alcohol Use: No    Prior to Admission medications   Medication Sig Start Date End Date Taking? Authorizing Provider  aspirin 325 MG tablet Take 325 mg by mouth daily.   Yes Historical Provider, MD  latanoprost (XALATAN) 0.005 % ophthalmic solution Place 1 drop into both eyes at bedtime.  05/23/13  Yes Historical Provider, MD  nitroGLYCERIN (NITROSTAT) 0.4 MG SL tablet Place 0.4 mg under the tongue every 5 (five) minutes as needed for chest pain.   Yes Historical Provider, MD  ramipril (ALTACE) 5 MG capsule Take 5 mg by mouth  daily.   Yes Historical Provider, MD  rosuvastatin (CRESTOR) 20 MG tablet Take 20 mg by mouth daily.   Yes Historical Provider, MD  sildenafil (VIAGRA) 50 MG tablet Take 50 mg by mouth daily as needed for erectile dysfunction.   Yes Historical Provider, MD  timolol (TIMOPTIC) 0.5 % ophthalmic solution Place 1 drop into both eyes daily.  05/23/13  Yes Historical Provider, MD    Current Facility-Administered Medications  Medication Dose Route Frequency Provider Last Rate Last Dose  . 0.9 %  sodium chloride infusion   Intravenous Continuous Celene Kras, MD      . 0.9 %  sodium chloride infusion   Intravenous Continuous Maxie Barb, MD      . acetaminophen (TYLENOL) tablet 650 mg  650 mg Oral Q6H PRN Maxie Barb, MD       Or  . acetaminophen (TYLENOL) suppository 650 mg  650 mg Rectal Q6H PRN Maxie Barb, MD      . aspirin tablet 325 mg  325 mg Oral Daily Maxie Barb, MD   325 mg at 06/01/13 1610  . atorvastatin (LIPITOR) tablet 40 mg  40 mg Oral q1800 Maxie Barb, MD      . enoxaparin (LOVENOX) injection 40 mg  40 mg Subcutaneous QHS Maxie Barb, MD   40 mg at 06/01/13 9604  .  latanoprost (XALATAN) 0.005 % ophthalmic solution 1 drop  1 drop Both Eyes QHS Maxie Barb, MD      . nitroGLYCERIN (NITROSTAT) SL tablet 0.4 mg  0.4 mg Sublingual Q5 min PRN Maxie Barb, MD      . ondansetron [facility]) tablet 4 mg  4 mg Oral Q6H PRN Maxie Barb, MD       Or  . ondansetron [facility]) injection 4 mg  4 mg Intravenous Q6H PRN Maxie Barb, MD      . ramipril (ALTACE) capsule 5 mg  5 mg Oral Daily Maxie Barb, MD   5 mg at 06/01/13 1610  . timolol (TIMOPTIC) 0.5 % ophthalmic solution 1 drop  1 drop Both Eyes Daily Maxie Barb, MD   1 drop at 06/01/13 9604   Current Outpatient Prescriptions  Medication Sig Dispense Refill  . aspirin 325 MG tablet Take 325 mg by mouth daily.      Marland Kitchen latanoprost (XALATAN) 0.005 % ophthalmic solution Place 1 drop into both eyes at  bedtime.       . nitroGLYCERIN (NITROSTAT) 0.4 MG SL tablet Place 0.4 mg under the tongue every 5 (five) minutes as needed for chest pain.      . ramipril (ALTACE) 5 MG capsule Take 5 mg by mouth daily.      . rosuvastatin (CRESTOR) 20 MG tablet Take 20 mg by mouth daily.      . sildenafil (VIAGRA) 50 MG tablet Take 50 mg by mouth daily as needed for erectile dysfunction.      . timolol (TIMOPTIC) 0.5 % ophthalmic solution Place 1 drop into both eyes daily.         Allergies as of 05/31/2013  . (No Known Allergies)   Review of Systems:    All systems reviewed and negative except where noted in HPI.   Physical Exam:  Vital signs in last 24 hours: Temp:  [96.9 F (36.1 C)-98.5 F (36.9 C)] 98.5 F (36.9 C) (12/31 0332) Pulse Rate:  [73-103] 76 (12/31 0332) Resp:  [16-18] 16 (12/31 0332) BP: (104-124)/(68-81) 106/68 mmHg (12/31 0332) SpO2:  [93 %-95 %] 94 % (12/31 0332) Weight:  [168 lb 3.2 oz (76.295 kg)] 168 lb 3.2 oz (76.295 kg) (12/30 1202)   General:   Thin black male in NAD Head:  Normocephalic and atraumatic. Eyes:   Icteric sclera.  Ears:  Normal auditory acuity. Neck:  Supple; no masses felt Lungs:  Respirations even and unlabored. Lungs clear to auscultation bilaterally. Diminished RLL breath sounds.   Heart:  Regular rate and rhythm Abdomen:  Soft, nondistended, nontender. Normal bowel sounds. No appreciable masses or hepatomegaly.  Rectal:  Not performed.  Msk:  Symmetrical without gross deformities.  Extremities:  Without edema. Neurologic:  Alert and  oriented x4;  grossly normal neurologically. Skin:  Intact without significant lesions or rashes. Cervical Nodes:  No significant cervical adenopathy. Psych:  Alert and cooperative. Normal affect.  LAB RESULTS:  Recent Labs  05/31/13 1305 06/01/13 0452  WBC 6.0 6.2  HGB 16.9 14.8  HCT 48.1 41.9  PLT 188 189   BMET  Recent Labs  05/31/13 1306 06/01/13 0452  NA 136 136*  K 4.0 3.6*  CL  --  100  CO2  24 27  GLUCOSE 106 90  BUN 10.1 10  CREATININE 0.8 0.61  CALCIUM 9.2 8.5   LFT  Recent Labs  06/01/13 0452  PROT 5.9*  ALBUMIN 1.9*  AST 196*  ALT 105*  ALKPHOS 702*  BILITOT 18.0*   PT/INR  Recent Labs  06/01/13 0452  LABPROT 13.9  INR 1.09    STUDIES: Dg Eye Foreign Body  06/01/2013   CLINICAL DATA:  78 year old male with history of metal exposure to the orbits and planned MRI. Initial encounter.  EXAM: ORBITS FOR FOREIGN BODY - 2 VIEW  COMPARISON:  None.  FINDINGS: There is no evidence of metallic foreign body within the orbits. No significant bone abnormality identified.  IMPRESSION: No evidence of metallic foreign body within the orbits.   Electronically Signed   By: Augusto Gamble M.D.   On: 06/01/2013 07:18   Mr 3d Recon At Scanner - images viewed Iva Boop, MD, [facility]   06/01/2013   CLINICAL DATA:  Worsening hepatic function.  EXAM: MRI ABDOMEN WITHOUT AND WITH CONTRAST (INCLUDING MRCP)  TECHNIQUE: Multiplanar multisequence MR imaging of the abdomen was performed both before and after the administration of intravenous contrast. Heavily T2-weighted images of the biliary and pancreatic ducts were obtained, and three-dimensional MRCP images were rendered by post processing.  CONTRAST:  15mL MULTIHANCE GADOBENATE DIMEGLUMINE 529 MG/ML IV SOLN  COMPARISON:  CT scan from 05/19/2013.  FINDINGS: There is some image degradation secondary to difficulty with breath holding. Compared to the previous CT scan, periportal edema persist. The intrahepatic biliary duct dilatation appears slightly progressed. Ducts in both the right and left hepatic lobes are dilated, but there is no evidence of dilatation of the common hepatic duct. In fact, the common hepatic duct appears obliterated and the intrahepatic bile ducts taper abruptly in the region of the left and right hepatic duct confluence.  Postcontrast imaging shows a 2 x 4 cm ill-defined region of abnormal delayed enhancement in the  central liver, involving and obliterating the confluence of the left and right hepatic ducts. This is best appreciated on image 45 of series 15.  There does appear to be mass effect on the bifurcation of the left and right portal veins, but no definite portal vein thrombus is identified. The hepatic veins opacify.  No other focal enhancing liver lesions are identified. The gallbladder is contracted with associated wall thickening.  No focal abnormality is seen in the spleen. The pancreas is normal. There is no dilatation of the main pancreatic duct. On the arterial phase imaging, there is a small focus of abnormal enhancement in the hepatoduodenal ligament which tracks along the expected course of the common duct. This is associated with abnormal signal in the central aspect of the hepatoduodenal ligament.  Small lymph nodes are seen in the hepatoduodenal ligament.  The adrenal glands are normal. The kidneys are normal. Moderate intra-abdominal ascites is evident. Numerous tiny foci of signal void throughout the visualized spine are compatible with the numerous tiny sclerotic lesions seen on the recent CT scan.  IMPRESSION: Periportal edema with associated dilatation of the left and right intrahepatic bile ducts with obliteration of the bile duct confluence in the central liver. The common duct is also obliterated. These changes are associated with a 2 x 4 cm focus of ill-defined late enhancement at the level of the hepatic duct confluence on postcontrast imaging and features are highly suggestive of a central obstructing mass lesion. Cholangiocarcinoma is a distinct consideration. Metastatic involvement is considered less likely.  Associated abnormal signal tracking in the hepatoduodenal ligament with a linear band of apparent enhancement along the expected course of the common duct. This may be related to enhancing tissue within the common duct.  No evidence for pancreatic head mass. There is no associated  dilatation of the main pancreatic duct.  Interval progression of ascites, now moderate in volume.   Electronically Signed   By: Kennith Center M.D.   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 He needs biliary decompression for obstructive jaundice. Dr. Leone Payor will review imaging studies and decide if ERCP with biopsies / stent placement is feasible. If not, will need IR to see for consideration of percutaneous biliary drain for decompression. He is afebrile, normal WBC. Antibiotics not started, hopefully he can get decompressed at some point today. Patient does have moderate ascites, likely malignant.   Thanks   LOS: 1 day   Jason [name]  06/01/2013, 11:38 AM  [facility] Attending  I have also seen and assessed the patient and agree with the above note.  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 Options to evaluate for dx and tx are ERCP or percutaneous biliary approach. I think the latter allows highest chance of success and have consulted interventional radiology.   I have explained to patient and wife that I think he has a cancer causing this problem.  He also has severe protein-calorie malnutrition - dietitian consult ordered to advise supplements, etc.  Will follow.  Iva Boop, MD, [facility] [phone] (pager) 06/01/2013 2:36 PM

## 2013-06-01 NOTE — Progress Notes (Signed)
   CARE MANAGEMENT ED NOTE 06/01/2013  Patient:  Jason [name], Jason [name]   Account Number:  [id number]  Date Initiated:  06/01/2013  Documentation initiated by:  Radford Pax  Subjective/Objective Assessment:   Patient presents to Ed with jaundice, weight loss, cough and darkened urine.     Subjective/Objective Assessment Detail:     Action/Plan:   Patient given IV fluids in ED.  Patient for possible billiary drain placement by IR 06/02/2013   Action/Plan Detail:   Anticipated DC Date:       Status Recommendation to Physician:   Result of Recommendation:    Other ED Services  Consult Working Plan    DC [employer]  Other    Choice offered to / List presented to:            Status of service:  Completed, signed off  ED Comments:   ED Comments Detail:  EDCM spoke to patient at bedside.  EDCM explained home health srvices to patient.  EDCM offered patient a list of home health agencies to patient and explained that he may have a visiting RN, PT, OT, aide and social worker if needed to come to his home.  Patient responded, "I don't think I will need that, but I will ask my wife."  Paktient reports he lives with his wife and his mother in law. Patient reports the only medical equipment hhe has at home is a cane.  No furhter CM needs at this time.

## 2013-06-01 NOTE — ED Notes (Signed)
Pt's wife will be going home soon and would like to be kept up to date with Pt's care. Telephone number [phone].

## 2013-06-01 NOTE — Progress Notes (Signed)
[facility] PROGRESS NOTE  Jason [name] ZOX:[id number] DOB: 1935-05-20 DOA: 05/31/2013 PCP: Provider Not In System  Assessment/Plan:  Cancer unknown origin -Consulted GI as cancer most likely of GI origin. CA 19-9= 28,035.3, elevated amylase, elevated AST/ALT, total bilirubin= 21.13 -Patient obtain MRCP; see results below -GI to perform ERCP vs cutaneous biliary biopsy  HTN -Currently within AHA guidelines, continue current medication  HLD -Continue home medication  Elevated alkaline phosphatase -See cancer unknown origin  Elevated liver enzymes -See cancer unknown origin      Code Status: Full Family Communication: Wife at bedside for discussion of plan of care  Disposition Plan: Per GI/oncology   Consultants:  Dr. Stan Head (Gastroenterology)   Procedures: 06/01/2013 MRCP Periportal edema with associated dilatation of the left and right  intrahepatic bile ducts with obliteration of the bile duct  confluence in the central liver. The common duct is also  obliterated. These changes are associated with a 2 x 4 cm focus of  ill-defined late enhancement at the level of the hepatic duct  confluence on postcontrast imaging and features are highly  suggestive of a central obstructing mass lesion. Cholangiocarcinoma  is a distinct consideration. Metastatic involvement is considered  less likely.  Associated abnormal signal tracking in the hepatoduodenal ligament  with a linear band of apparent enhancement along the expected course  of the common duct. This may be related to enhancing tissue within  the common duct.  No evidence for pancreatic head mass. There is no associated  dilatation of the main pancreatic duct.  Interval progression of ascites, now moderate in volume.    CT abdomen pelvis with contrast 05/16/2013 1. Nonspecific area of scar-like consolidation is noted in the right  upper lobe. This is favored to be the sequela of chronic infection   or inflammation.  2. Diffuse multifocal sclerotic bone lesions are identified  worrisome for metastatic disease.  CT abdomen and pelvis:  1. Intrahepatic bile duct dilatation with normal caliber common bile  duct. In a patient who has elevated liver function tests I cannot  exclude centrally obstructing lesion. Consider further evaluation  with ERCP or MRCP.  2. Abdominal and pelvic ascites  3. Diffuse sclerotic bone lesions identified worrisome for  widespread metastatic disease.  Antibiotics:      HPI/Subjective: 78 y.o. male BM PMHx hypertension, dyslipidemia on statin, CAD S/P  PCI, and tobacco abuse. He describes symptoms that include weight loss over approximately 3 years, approximately 20-40 pounds but difficult to quantify, he says. He initially intended to lose some extra weight by limiting his portions but then continued to lose weight unintentionally. He does complain of lack of appetite and some decrease in PO intake. He endorses darkened urine, which now appears cola colored. Additionally, he endorses some lightening/acholic stools over several months. The patient denies fevers or chills. He has a chronic cough which is productive of scant mucoid sputum. He describes his functional capacity as good, being able to walk 1 mile without fatiguing. He lives at home with his wife and an older family member.  The patient was recently seen by Dr. Katrinka Blazing for general evaluation. Presumably after that visit it was discovered that he had hyperbilirubinemia to 10.3, ALP 591, AST 170, and ALT 103. He underwent CT scan of the chest that revealed RUL scar-like opacitifcation 5.8 x 1.3 x 1.5 cm. There was mild-mod intrahepatic biliary ductal diltation but no CBD dil. Additionally there was mention of bony lesions possibly consistent with metastatic disease. He  was evaluated by Dr. Si Gaul 06/01/2013 patient states negative abdominal pain, negative back pain, negative N./V. negative SOB  negative CP.   Objective: Filed Vitals:   06/01/13 1201 06/01/13 1606 06/01/13 1643 06/01/13 2119  BP: 127/76 111/76 125/78 113/70  Pulse: 62 64 57 58  Temp: 98.2 F (36.8 C) 97.8 F (36.6 C) 97.7 F (36.5 C) 98.5 F (36.9 C)  TempSrc: Oral Oral Oral Oral  Resp: 18 20 18 16   SpO2: 98% 96% 95% 97%    Intake/Output Summary (Last 24 hours) at 06/01/13 2132 Last data filed at 06/01/13 1913  Gross per 24 hour  Intake    240 ml  Output    301 ml  Net    -61 ml   There were no vitals filed for this visit.  Exam:   General: A./O. x4, NAD, icteric eyes  Cardiovascular: Regular rate, negative murmurs rubs or  Respiratory: Clear to auscultation bilateral  Abdomen: Soft, mildly distended, nontender, plus bowel sounds  Musculoskeletal: Negative pedal edema, plaque lesions on bilateral shins   Data Reviewed: Basic Metabolic Panel:  Recent Labs Lab 05/31/13 1306 06/01/13 0452  NA 136 136*  K 4.0 3.6*  CL  --  100  CO2 24 27  GLUCOSE 106 90  BUN 10.1 10  CREATININE 0.8 0.61  CALCIUM 9.2 8.5   Liver Function Tests:  Recent Labs Lab 05/31/13 1306 06/01/13 0452  AST 231* 196*  ALT 132* 105*  ALKPHOS 866* 702*  BILITOT 21.13* 18.0*  PROT 7.6 5.9*  ALBUMIN 2.2* 1.9*    Recent Labs Lab 06/01/13 0904  LIPASE 36  AMYLASE 106*   No results found for this basename: AMMONIA,  in the last 168 hours CBC:  Recent Labs Lab 05/31/13 1305 06/01/13 0452  WBC 6.0 6.2  NEUTROABS 4.4  --   HGB 16.9 14.8  HCT 48.1 41.9  MCV 92.3 92.7  PLT 188 189   Cardiac Enzymes: No results found for this basename: CKTOTAL, CKMB, CKMBINDEX, TROPONINI,  in the last 168 hours BNP (last 3 results) No results found for this basename: PROBNP,  in the last 8760 hours CBG: No results found for this basename: GLUCAP,  in the last 168 hours  No results found for this or any previous visit (from the past 240 hour(s)).   Studies: Dg Eye Foreign Body  06/01/2013   CLINICAL DATA:   78 year old male with history of metal exposure to the orbits and planned MRI. Initial encounter.  EXAM: ORBITS FOR FOREIGN BODY - 2 VIEW  COMPARISON:  None.  FINDINGS: There is no evidence of metallic foreign body within the orbits. No significant bone abnormality identified.  IMPRESSION: No evidence of metallic foreign body within the orbits.   Electronically Signed   By: Augusto Gamble M.D.   On: 06/01/2013 07:18   Mr 3d Recon At Scanner  06/01/2013   CLINICAL DATA:  Worsening hepatic function.  EXAM: MRI ABDOMEN WITHOUT AND WITH CONTRAST (INCLUDING MRCP)  TECHNIQUE: Multiplanar multisequence MR imaging of the abdomen was performed both before and after the administration of intravenous contrast. Heavily T2-weighted images of the biliary and pancreatic ducts were obtained, and three-dimensional MRCP images were rendered by post processing.  CONTRAST:  15mL MULTIHANCE GADOBENATE DIMEGLUMINE 529 MG/ML IV SOLN  COMPARISON:  CT scan from 05/19/2013.  FINDINGS: There is some image degradation secondary to difficulty with breath holding. Compared to the previous CT scan, periportal edema persist. The intrahepatic biliary duct dilatation appears slightly progressed.  Ducts in both the right and left hepatic lobes are dilated, but there is no evidence of dilatation of the common hepatic duct. In fact, the common hepatic duct appears obliterated and the intrahepatic bile ducts taper abruptly in the region of the left and right hepatic duct confluence.  Postcontrast imaging shows a 2 x 4 cm ill-defined region of abnormal delayed enhancement in the central liver, involving and obliterating the confluence of the left and right hepatic ducts. This is best appreciated on image 45 of series 15.  There does appear to be mass effect on the bifurcation of the left and right portal veins, but no definite portal vein thrombus is identified. The hepatic veins opacify.  No other focal enhancing liver lesions are identified. The  gallbladder is contracted with associated wall thickening.  No focal abnormality is seen in the spleen. The pancreas is normal. There is no dilatation of the main pancreatic duct. On the arterial phase imaging, there is a small focus of abnormal enhancement in the hepatoduodenal ligament which tracks along the expected course of the common duct. This is associated with abnormal signal in the central aspect of the hepatoduodenal ligament.  Small lymph nodes are seen in the hepatoduodenal ligament.  The adrenal glands are normal. The kidneys are normal. Moderate intra-abdominal ascites is evident. Numerous tiny foci of signal void throughout the visualized spine are compatible with the numerous tiny sclerotic lesions seen on the recent CT scan.  IMPRESSION: Periportal edema with associated dilatation of the left and right intrahepatic bile ducts with obliteration of the bile duct confluence in the central liver. The common duct is also obliterated. These changes are associated with a 2 x 4 cm focus of ill-defined late enhancement at the level of the hepatic duct confluence on postcontrast imaging and features are highly suggestive of a central obstructing mass lesion. Cholangiocarcinoma is a distinct consideration. Metastatic involvement is considered less likely.  Associated abnormal signal tracking in the hepatoduodenal ligament with a linear band of apparent enhancement along the expected course of the common duct. This may be related to enhancing tissue within the common duct.  No evidence for pancreatic head mass. There is no associated dilatation of the main pancreatic duct.  Interval progression of ascites, now moderate in volume.   Electronically Signed   By: Kennith Center M.D.   On: 06/01/2013 10:02   Mr Abd W/wo Cm/mrcp  06/01/2013   CLINICAL DATA:  Worsening hepatic function.  EXAM: MRI ABDOMEN WITHOUT AND WITH CONTRAST (INCLUDING MRCP)  TECHNIQUE: Multiplanar multisequence MR imaging of the abdomen was  performed both before and after the administration of intravenous contrast. Heavily T2-weighted images of the biliary and pancreatic ducts were obtained, and three-dimensional MRCP images were rendered by post processing.  CONTRAST:  15mL MULTIHANCE GADOBENATE DIMEGLUMINE 529 MG/ML IV SOLN  COMPARISON:  CT scan from 05/19/2013.  FINDINGS: There is some image degradation secondary to difficulty with breath holding. Compared to the previous CT scan, periportal edema persist. The intrahepatic biliary duct dilatation appears slightly progressed. Ducts in both the right and left hepatic lobes are dilated, but there is no evidence of dilatation of the common hepatic duct. In fact, the common hepatic duct appears obliterated and the intrahepatic bile ducts taper abruptly in the region of the left and right hepatic duct confluence.  Postcontrast imaging shows a 2 x 4 cm ill-defined region of abnormal delayed enhancement in the central liver, involving and obliterating the confluence of the left and right  hepatic ducts. This is best appreciated on image 45 of series 15.  There does appear to be mass effect on the bifurcation of the left and right portal veins, but no definite portal vein thrombus is identified. The hepatic veins opacify.  No other focal enhancing liver lesions are identified. The gallbladder is contracted with associated wall thickening.  No focal abnormality is seen in the spleen. The pancreas is normal. There is no dilatation of the main pancreatic duct. On the arterial phase imaging, there is a small focus of abnormal enhancement in the hepatoduodenal ligament which tracks along the expected course of the common duct. This is associated with abnormal signal in the central aspect of the hepatoduodenal ligament.  Small lymph nodes are seen in the hepatoduodenal ligament.  The adrenal glands are normal. The kidneys are normal. Moderate intra-abdominal ascites is evident. Numerous tiny foci of signal void  throughout the visualized spine are compatible with the numerous tiny sclerotic lesions seen on the recent CT scan.  IMPRESSION: Periportal edema with associated dilatation of the left and right intrahepatic bile ducts with obliteration of the bile duct confluence in the central liver. The common duct is also obliterated. These changes are associated with a 2 x 4 cm focus of ill-defined late enhancement at the level of the hepatic duct confluence on postcontrast imaging and features are highly suggestive of a central obstructing mass lesion. Cholangiocarcinoma is a distinct consideration. Metastatic involvement is considered less likely.  Associated abnormal signal tracking in the hepatoduodenal ligament with a linear band of apparent enhancement along the expected course of the common duct. This may be related to enhancing tissue within the common duct.  No evidence for pancreatic head mass. There is no associated dilatation of the main pancreatic duct.  Interval progression of ascites, now moderate in volume.   Electronically Signed   By: Kennith Center M.D.   On: 06/01/2013 10:02    Scheduled Meds: . aspirin  325 mg Oral Daily  . atorvastatin  40 mg Oral q1800  . enoxaparin (LOVENOX) injection  40 mg Subcutaneous QHS  . latanoprost  1 drop Both Eyes QHS  . ramipril  5 mg Oral Daily  . timolol  1 drop Both Eyes Daily   Continuous Infusions: . sodium chloride 125 mL/hr (06/01/13 1701)    Principal Problem:   Cancer of unknown origin Active Problems:   CAD (coronary artery disease)   Hypertension   Hyperlipidemia   Hyperbilirubinemia   Elevated alkaline phosphatase level   Elevated liver enzymes   Bile duct obstruction   Severe protein-calorie malnutrition    Time spent: 60 minutes   Stefanee Mckell, J  [facility] Pager [phone]. If 7PM-7AM, please contact night-coverage at [email or web address], password [facility] 06/01/2013, 9:32 PM  LOS: 1 day

## 2013-06-01 NOTE — ED Notes (Signed)
Patient transported to MRI 

## 2013-06-01 NOTE — H&P (Signed)
Chief Complaint: "Weight loss, decreased appetite and yellowing of my eyes." Referring Physician: Dr. Leone Payor HPI: Jason [name] is an 78 y.o. male who was seen for an annual physical on 05/12/13 with his cardiologist Dr. Katrinka Blazing. He was noted to have 20 lb weight loss in the past 12 months and given PMHx of tobacco use a CXR was ordered as well as some annual labs. The patient had a CXR on 05/17/13 which revealed abnormality and malignancy could not be excluded so a CT was ordered followed by a MRI which revealed abnormal findings. The patient was scheduled to see Dr. Arbutus Ped at the cancer center 05/31/13. He had labs drawn which revealed elevated LFT's and bilirubin. He was instructed to present to the ED. GI has evaluated the patient and has requested an image guided bilary drain to be placed. The patient upon interview states he has noticed his appetite is decreased since mid summer and he has lost approximately 35-40 lbs in the past 3 years. He is an ongoing tobacco user 1/2 ppd x 60 years. He denies any alcohol use. He denies any abdominal pain, n/v, fever or chills. He denies any blood in his stool or urine, but does admit to light colored stool and dark urine. He denies any chest pain or cough and admits to intermittent shortness of breath. He does not use oxygen and denies any history of sleep apnea. He states he first noticed his surrounding eye color was yellow when Dr. Arbutus Ped brought his attention to this. He denies any itching.  Past Medical History:  Past Medical History  Diagnosis Date  . CAD (coronary artery disease)     , acute IMI  . Hypertension   . Hyperlipidemia     Past Surgical History:  Past Surgical History  Procedure Laterality Date  . Heart stint    . Cataracts      Family History:  Family History  Problem Relation Age of Onset  . Stomach cancer Brother     Social History:  reports that he has been smoking Cigarettes.  He has a 30 pack-year smoking history. He has  never used smokeless tobacco. He reports that he does not drink alcohol or use illicit drugs.  Allergies: No Known Allergies  Medications:   Medication List    ASK your doctor about these medications       aspirin 325 MG tablet  Take 325 mg by mouth daily.     latanoprost 0.005 % ophthalmic solution  Commonly known as:  XALATAN  Place 1 drop into both eyes at bedtime.     nitroGLYCERIN 0.4 MG SL tablet  Commonly known as:  NITROSTAT  Place 0.4 mg under the tongue every 5 (five) minutes as needed for chest pain.     ramipril 5 MG capsule  Commonly known as:  ALTACE  Take 5 mg by mouth daily.     rosuvastatin 20 MG tablet  Commonly known as:  CRESTOR  Take 20 mg by mouth daily.     sildenafil 50 MG tablet  Commonly known as:  VIAGRA  Take 50 mg by mouth daily as needed for erectile dysfunction.     timolol 0.5 % ophthalmic solution  Commonly known as:  TIMOPTIC  Place 1 drop into both eyes daily.       Please HPI for pertinent positives, otherwise complete 10 system ROS negative.  Physical Exam: BP 127/76  Pulse 62  Temp(Src) 98.2 F (36.8 C) (Oral)  Resp 18  SpO2 98% There is no weight on file to calculate BMI.  General Appearance:  Alert, cooperative, no distress, icteric sclera  Head:  Normocephalic, without obvious abnormality, atraumatic  ENT: Unremarkable  Neck: Supple, symmetrical, trachea midline  Lungs:   Diminished bilaterally, no w/r/r, respirations unlabored without use of accessory muscles.  Chest Wall:  No tenderness or deformity  Heart:  Regular rate and rhythm, S1, S2 normal, no murmur, rub or gallop.  Abdomen:   Soft, non-tender, non distended, (+) BS  Extremities: Extremities normal, atraumatic, no cyanosis or edema  Neurologic: Normal affect, no gross deficits.   Results for orders placed during the hospital encounter of 05/31/13 (from the past 48 hour(s))  COMPREHENSIVE METABOLIC PANEL     Status: Abnormal   Collection Time    06/01/13   4:52 AM      Result Value Range   Sodium 136 (*) 137 - 147 mEq/L   Comment: Please note change in reference range.   Potassium 3.6 (*) 3.7 - 5.3 mEq/L   Comment: Please note change in reference range.   Chloride 100  96 - 112 mEq/L   CO2 27  19 - 32 mEq/L   Glucose, Bld 90  70 - 99 mg/dL   BUN 10  6 - 23 mg/dL   Creatinine, Ser 1.61  0.50 - 1.35 mg/dL   Comment: ICTERUS AT THIS LEVEL MAY AFFECT RESULT   Calcium 8.5  8.4 - 10.5 mg/dL   Total Protein 5.9 (*) 6.0 - 8.3 g/dL   Comment: ICTERUS AT THIS LEVEL MAY AFFECT RESULT   Albumin 1.9 (*) 3.5 - 5.2 g/dL   AST 096 (*) 0 - 37 U/L   ALT 105 (*) 0 - 53 U/L   Alkaline Phosphatase 702 (*) 39 - 117 U/L   Total Bilirubin 18.0 (*) 0.3 - 1.2 mg/dL   GFR calc non Af Amer >90  >90 mL/min   GFR calc Af Amer >90  >90 mL/min   Comment: (NOTE)     The eGFR has been calculated using the CKD EPI equation.     This calculation has not been validated in all clinical situations.     eGFR's persistently <90 mL/min signify possible Chronic Kidney     Disease.  PROTIME-INR     Status: None   Collection Time    06/01/13  4:52 AM      Result Value Range   Prothrombin Time 13.9  11.6 - 15.2 seconds   INR 1.09  0.00 - 1.49  CBC     Status: Abnormal   Collection Time    06/01/13  4:52 AM      Result Value Range   WBC 6.2  4.0 - 10.5 K/uL   RBC 4.52  4.22 - 5.81 MIL/uL   Hemoglobin 14.8  13.0 - 17.0 g/dL   HCT 04.5  40.9 - 81.1 %   MCV 92.7  78.0 - 100.0 fL   MCH 32.7  26.0 - 34.0 pg   MCHC 35.3  30.0 - 36.0 g/dL   RDW 91.4 (*) 78.2 - 95.6 %   Platelets 189  150 - 400 K/uL  LIPASE, BLOOD     Status: None   Collection Time    06/01/13  9:04 AM      Result Value Range   Lipase 36  11 - 59 U/L  AMYLASE     Status: Abnormal   Collection Time    06/01/13  9:04 AM  Result Value Range   Amylase 106 (*) 0 - 105 U/L   Dg Eye Foreign Body  06/01/2013   CLINICAL DATA:  78 year old male with history of metal exposure to the orbits and planned  MRI. Initial encounter.  EXAM: ORBITS FOR FOREIGN BODY - 2 VIEW  COMPARISON:  None.  FINDINGS: There is no evidence of metallic foreign body within the orbits. No significant bone abnormality identified.  IMPRESSION: No evidence of metallic foreign body within the orbits.   Electronically Signed   By: Augusto Gamble M.D.   On: 06/01/2013 07:18   Mr 3d Recon At Scanner  06/01/2013   CLINICAL DATA:  Worsening hepatic function.  EXAM: MRI ABDOMEN WITHOUT AND WITH CONTRAST (INCLUDING MRCP)  TECHNIQUE: Multiplanar multisequence MR imaging of the abdomen was performed both before and after the administration of intravenous contrast. Heavily T2-weighted images of the biliary and pancreatic ducts were obtained, and three-dimensional MRCP images were rendered by post processing.  CONTRAST:  15mL MULTIHANCE GADOBENATE DIMEGLUMINE 529 MG/ML IV SOLN  COMPARISON:  CT scan from 05/19/2013.  FINDINGS: There is some image degradation secondary to difficulty with breath holding. Compared to the previous CT scan, periportal edema persist. The intrahepatic biliary duct dilatation appears slightly progressed. Ducts in both the right and left hepatic lobes are dilated, but there is no evidence of dilatation of the common hepatic duct. In fact, the common hepatic duct appears obliterated and the intrahepatic bile ducts taper abruptly in the region of the left and right hepatic duct confluence.  Postcontrast imaging shows a 2 x 4 cm ill-defined region of abnormal delayed enhancement in the central liver, involving and obliterating the confluence of the left and right hepatic ducts. This is best appreciated on image 45 of series 15.  There does appear to be mass effect on the bifurcation of the left and right portal veins, but no definite portal vein thrombus is identified. The hepatic veins opacify.  No other focal enhancing liver lesions are identified. The gallbladder is contracted with associated wall thickening.  No focal abnormality  is seen in the spleen. The pancreas is normal. There is no dilatation of the main pancreatic duct. On the arterial phase imaging, there is a small focus of abnormal enhancement in the hepatoduodenal ligament which tracks along the expected course of the common duct. This is associated with abnormal signal in the central aspect of the hepatoduodenal ligament.  Small lymph nodes are seen in the hepatoduodenal ligament.  The adrenal glands are normal. The kidneys are normal. Moderate intra-abdominal ascites is evident. Numerous tiny foci of signal void throughout the visualized spine are compatible with the numerous tiny sclerotic lesions seen on the recent CT scan.  IMPRESSION: Periportal edema with associated dilatation of the left and right intrahepatic bile ducts with obliteration of the bile duct confluence in the central liver. The common duct is also obliterated. These changes are associated with a 2 x 4 cm focus of ill-defined late enhancement at the level of the hepatic duct confluence on postcontrast imaging and features are highly suggestive of a central obstructing mass lesion. Cholangiocarcinoma is a distinct consideration. Metastatic involvement is considered less likely.  Associated abnormal signal tracking in the hepatoduodenal ligament with a linear band of apparent enhancement along the expected course of the common duct. This may be related to enhancing tissue within the common duct.  No evidence for pancreatic head mass. There is no associated dilatation of the main pancreatic duct.  Interval progression of  ascites, now moderate in volume.   Electronically Signed   By: Kennith Center M.D.   On: 06/01/2013 10:02   Mr Abd W/wo Cm/mrcp  06/01/2013   CLINICAL DATA:  Worsening hepatic function.  EXAM: MRI ABDOMEN WITHOUT AND WITH CONTRAST (INCLUDING MRCP)  TECHNIQUE: Multiplanar multisequence MR imaging of the abdomen was performed both before and after the administration of intravenous contrast.  Heavily T2-weighted images of the biliary and pancreatic ducts were obtained, and three-dimensional MRCP images were rendered by post processing.  CONTRAST:  15mL MULTIHANCE GADOBENATE DIMEGLUMINE 529 MG/ML IV SOLN  COMPARISON:  CT scan from 05/19/2013.  FINDINGS: There is some image degradation secondary to difficulty with breath holding. Compared to the previous CT scan, periportal edema persist. The intrahepatic biliary duct dilatation appears slightly progressed. Ducts in both the right and left hepatic lobes are dilated, but there is no evidence of dilatation of the common hepatic duct. In fact, the common hepatic duct appears obliterated and the intrahepatic bile ducts taper abruptly in the region of the left and right hepatic duct confluence.  Postcontrast imaging shows a 2 x 4 cm ill-defined region of abnormal delayed enhancement in the central liver, involving and obliterating the confluence of the left and right hepatic ducts. This is best appreciated on image 45 of series 15.  There does appear to be mass effect on the bifurcation of the left and right portal veins, but no definite portal vein thrombus is identified. The hepatic veins opacify.  No other focal enhancing liver lesions are identified. The gallbladder is contracted with associated wall thickening.  No focal abnormality is seen in the spleen. The pancreas is normal. There is no dilatation of the main pancreatic duct. On the arterial phase imaging, there is a small focus of abnormal enhancement in the hepatoduodenal ligament which tracks along the expected course of the common duct. This is associated with abnormal signal in the central aspect of the hepatoduodenal ligament.  Small lymph nodes are seen in the hepatoduodenal ligament.  The adrenal glands are normal. The kidneys are normal. Moderate intra-abdominal ascites is evident. Numerous tiny foci of signal void throughout the visualized spine are compatible with the numerous tiny sclerotic  lesions seen on the recent CT scan.  IMPRESSION: Periportal edema with associated dilatation of the left and right intrahepatic bile ducts with obliteration of the bile duct confluence in the central liver. The common duct is also obliterated. These changes are associated with a 2 x 4 cm focus of ill-defined late enhancement at the level of the hepatic duct confluence on postcontrast imaging and features are highly suggestive of a central obstructing mass lesion. Cholangiocarcinoma is a distinct consideration. Metastatic involvement is considered less likely.  Associated abnormal signal tracking in the hepatoduodenal ligament with a linear band of apparent enhancement along the expected course of the common duct. This may be related to enhancing tissue within the common duct.  No evidence for pancreatic head mass. There is no associated dilatation of the main pancreatic duct.  Interval progression of ascites, now moderate in volume.   Electronically Signed   By: Kennith Center M.D.   On: 06/01/2013 10:02   Assessment/Plan Obstructive jaundice. Elevated LFT's Elevated AFP and CEA 19-9 Ascites. Questionable metastatic cancer, has been seen by Dr. Arbutus Ped 12/30. Request for image guided percutaneous biliary drain placement. Images reviewed by Dr. Archer Asa who will discuss the case with Dr. Leone Payor today over the phone and Dr. Arbutus Ped. Patient afebrile and stable. We will  perform ultrasound guided paracentesis in hopes of obtaining information for a diagnosis. PTC and ERCP would be high risk in this setting and the patient is currently stable.   Pattricia Boss D PA-C 06/01/2013, 3:26 PM

## 2013-06-01 NOTE — Progress Notes (Signed)
Patient received via wheelchair from the ED, alert and oriented.  No complaints of pain, oriented to room, ordering meals, safety video, nurse call light, tv controls, need to call for assistance to bathroom.  Patient verbalized understanding of above information.  Discussed that he is scheduled for a paracentesis tomorrow.

## 2013-06-01 NOTE — ED Notes (Signed)
Bed: UJ81 Expected date:  Expected time:  Means of arrival:  Comments: Hold for room 21

## 2013-06-02 ENCOUNTER — Inpatient Hospital Stay (HOSPITAL_COMMUNITY): Payer: Medicare Other

## 2013-06-02 DIAGNOSIS — C7951 Secondary malignant neoplasm of bone: Secondary | ICD-10-CM

## 2013-06-02 DIAGNOSIS — C7952 Secondary malignant neoplasm of bone marrow: Secondary | ICD-10-CM

## 2013-06-02 NOTE — Progress Notes (Signed)
[facility] PROGRESS NOTE  Jason [name] [name] LHT:[id number] DOB: 08-19-1934 DOA: 05/31/2013 PCP: Provider Not In System  Assessment/Plan:  Cancer unknown origin -Consulted GI as cancer most likely of GI origin. CA 19-9= 28,035.3, elevated amylase, elevated AST/ALT, total bilirubin= 21.13 -Patient obtain MRCP; see results below -GI to perform ERCP vs Cutaneous Biliary Biopsy NOTE spoke with Jason Jason [name] [name] this a.m. 06/02/2013 Jason [name] she is unsure if they will be able to obtain a biopsy. Stated Dr. Leroy [name] will be rounding this afternoon Jason [name] will provide further guidance on if they will attempt to obtain a tissue sample or just wait for results from peritoneal fluid obtained this a.m. -Counseled patient I would attempt to obtain more guidance Jason [name] if no further procedures planned he should be able to be discharged.  HTN -Currently within AHA guidelines, continue current medication  HLD -Continue home medication  Elevated alkaline phosphatase -See cancer unknown origin  Elevated liver enzymes -See cancer unknown origin    Code Status: Full Family Communication: Wife at bedside for discussion of plan of care  Disposition Plan: Per GI/oncology   Consultants:  Jason [name] [name] (Gastroenterology)   Procedures: 06/02/2013 Jason [name] [name] performed US guided paracentesis from RLQ.  Yielded 900 ml of bright yellow fluid.    06/01/2013 MRCP Periportal edema with associated dilatation of the left Jason [name] right  intrahepatic bile ducts with obliteration of the bile duct  confluence in the central liver. The common duct is also  obliterated. These changes are associated with a 2 x 4 cm focus of  ill-defined late enhancement at the level of the hepatic duct  confluence on postcontrast imaging Jason [name] features are highly  suggestive of a central obstructing mass lesion. Cholangiocarcinoma  is a distinct consideration. Metastatic involvement is considered  less likely.  Associated abnormal signal  tracking in the hepatoduodenal ligament  with a linear band of apparent enhancement along the expected course  of the common duct. This may be related to enhancing tissue within  the common duct.  No evidence for pancreatic head mass. There is no associated  dilatation of the main pancreatic duct.  Interval progression of ascites, now moderate in volume.   CT abdomen pelvis with contrast 05/16/2013 1. Nonspecific area of scar-like consolidation is noted in the right  upper lobe. This is favored to be the sequela of chronic infection  or inflammation.  2. Diffuse multifocal sclerotic bone lesions are identified  worrisome for metastatic disease.  CT abdomen Jason [name] pelvis:  1. Intrahepatic bile duct dilatation with normal caliber common bile  duct. In a patient who has elevated liver function tests I cannot  exclude centrally obstructing lesion. Consider further evaluation  with ERCP or MRCP.  2. Abdominal Jason [name] pelvic ascites  3. Diffuse sclerotic bone lesions identified worrisome for  widespread metastatic disease.  Antibiotics:      HPI/Subjective: 79 y.o. male Jason [name] [name], Jason [name] [name], Jason [name] S/P  [name], Jason [name] [name]. He describes symptoms that include weight loss over approximately 3 years, approximately 20-40 pounds but difficult to quantify, he says. He initially intended to lose some extra weight by limiting his portions but then continued to lose weight unintentionally. He does complain of lack of appetite Jason [name] some decrease in PO intake. He endorses darkened urine, which now appears cola colored. Additionally, he endorses some lightening/acholic stools over several months. The patient denies fevers or chills. He has a chronic cough which is productive of scant mucoid sputum. He describes his functional capacity as  good, being able to walk 1 mile without fatiguing. He lives at home with his wife Jason [name] an older family member.  The patient was recently seen by Dr.  Tamala [name] for general evaluation. Presumably after that visit it was discovered that he had hyperbilirubinemia to 10.3, ALP 591, AST 170, Jason [name] ALT 103. He underwent CT scan of the chest that revealed RUL scar-like opacitifcation 5.8 x 1.3 x 1.5 cm. There was mild-mod intrahepatic biliary ductal diltation but no CBD dil. Additionally there was mention of bony lesions possibly consistent with metastatic disease. He was evaluated by Jason [name] [name] 06/01/2013 patient states negative abdominal pain, negative back pain, negative N./V. negative SOB negative CP. 06/02/2013 patient sitting comfortably on the edge of the bed, states negative back pain, negative CP, negative SOB, negative abdominal pain. Patient would like to be discharged today if no biopsy is going to be performed, because he understands that the results from the paracentesis from this a.m. will take 48-72 hours.   Objective: Filed Vitals:   06/01/13 2119 06/02/13 0535 06/02/13 0909 06/02/13 0918  BP: 113/70 107/70 108/72 103/78  Pulse: 58 81    Temp: 98.5 F (36.9 C) 98.4 F (36.9 C)    TempSrc: Oral Oral    Resp: 16 16    SpO2: 97% 98%      Intake/Output Summary (Last 24 hours) at 06/02/13 1024 Last data filed at 06/02/13 0744  Gross per 24 hour  Intake    480 ml  Output    301 ml  Net    179 ml   There were no vitals filed for this visit.  Exam:   General: A./O. x4, NAD, icteric eyes  Cardiovascular: Regular rate, negative murmurs rubs or  Respiratory: Clear to auscultation bilateral  Abdomen: Soft, mildly distended, nontender, plus bowel sounds  Musculoskeletal: Negative pedal edema, plaque lesions on bilateral shins   Data Reviewed: Basic Metabolic Panel:  Recent Labs Lab 05/31/13 1306 06/01/13 0452  NA 136 136*  K 4.0 3.6*  CL  --  100  CO2 24 27  GLUCOSE 106 90  BUN 10.1 10  CREATININE 0.8 0.61  CALCIUM 9.2 8.5   Liver Function Tests:  Recent Labs Lab 05/31/13 1306 06/01/13 0452  AST 231* 196*   ALT 132* 105*  ALKPHOS 866* 702*  BILITOT 21.13* 18.0*  PROT 7.6 5.9*  ALBUMIN 2.2* 1.9*    Recent Labs Lab 06/01/13 0904  LIPASE 36  AMYLASE 106*   No results found for this basename: AMMONIA,  in the last 168 hours CBC:  Recent Labs Lab 05/31/13 1305 06/01/13 0452  WBC 6.0 6.2  NEUTROABS 4.4  --   HGB 16.9 14.8  HCT 48.1 41.9  MCV 92.3 92.7  PLT 188 189   Cardiac Enzymes: No results found for this basename: CKTOTAL, CKMB, CKMBINDEX, TROPONINI,  in the last 168 hours BNP (last 3 results) No results found for this basename: PROBNP,  in the last 8760 hours CBG: No results found for this basename: GLUCAP,  in the last 168 hours  No results found for this or any previous visit (from the past 240 hour(s)).   Studies: Dg Eye Foreign Body  06/01/2013   CLINICAL DATA:  79 year old male with history of metal exposure to the orbits Jason [name] planned MRI. Initial encounter.  EXAM: ORBITS FOR FOREIGN BODY - 2 VIEW  COMPARISON:  None.  FINDINGS: There is no evidence of metallic foreign body within the orbits. No significant bone abnormality identified.  IMPRESSION: No evidence of metallic foreign body within the orbits.   Electronically Signed   By: Lars Pinks M.D.   On: 06/01/2013 07:18   Mr 3d Recon At Scanner  06/01/2013   CLINICAL DATA:  Worsening hepatic function.  EXAM: MRI ABDOMEN WITHOUT Jason [name] WITH CONTRAST (INCLUDING MRCP)  TECHNIQUE: Multiplanar multisequence MR imaging of the abdomen was performed both before Jason [name] after the administration of intravenous contrast. Heavily T2-weighted images of the biliary Jason [name] pancreatic ducts were obtained, Jason [name] three-dimensional MRCP images were rendered by post processing.  CONTRAST:  59mL MULTIHANCE GADOBENATE DIMEGLUMINE 529 MG/ML IV SOLN  COMPARISON:  CT scan from 05/19/2013.  FINDINGS: There is some image degradation secondary to difficulty with breath holding. Compared to the previous CT scan, periportal edema persist. The intrahepatic biliary  duct dilatation appears slightly progressed. Ducts in both the right Jason [name] left hepatic lobes are dilated, but there is no evidence of dilatation of the common hepatic duct. In fact, the common hepatic duct appears obliterated Jason [name] the intrahepatic bile ducts taper abruptly in the region of the left Jason [name] right hepatic duct confluence.  Postcontrast imaging shows a 2 x 4 cm ill-defined region of abnormal delayed enhancement in the central liver, involving Jason [name] obliterating the confluence of the left Jason [name] right hepatic ducts. This is best appreciated on image 45 of series 15.  There does appear to be mass effect on the bifurcation of the left Jason [name] right portal veins, but no definite portal vein thrombus is identified. The hepatic veins opacify.  No other focal enhancing liver lesions are identified. The gallbladder is contracted with associated wall thickening.  No focal abnormality is seen in the spleen. The pancreas is normal. There is no dilatation of the main pancreatic duct. On the arterial phase imaging, there is a small focus of abnormal enhancement in the hepatoduodenal ligament which tracks along the expected course of the common duct. This is associated with abnormal signal in the central aspect of the hepatoduodenal ligament.  Small lymph nodes are seen in the hepatoduodenal ligament.  The adrenal glands are normal. The kidneys are normal. Moderate intra-abdominal ascites is evident. Numerous tiny foci of signal void throughout the visualized spine are compatible with the numerous tiny sclerotic lesions seen on the recent CT scan.  IMPRESSION: Periportal edema with associated dilatation of the left Jason [name] right intrahepatic bile ducts with obliteration of the bile duct confluence in the central liver. The common duct is also obliterated. These changes are associated with a 2 x 4 cm focus of ill-defined late enhancement at the level of the hepatic duct confluence on postcontrast imaging Jason [name] features are highly  suggestive of a central obstructing mass lesion. Cholangiocarcinoma is a distinct consideration. Metastatic involvement is considered less likely.  Associated abnormal signal tracking in the hepatoduodenal ligament with a linear band of apparent enhancement along the expected course of the common duct. This may be related to enhancing tissue within the common duct.  No evidence for pancreatic head mass. There is no associated dilatation of the main pancreatic duct.  Interval progression of ascites, now moderate in volume.   Electronically Signed   By: Misty Stanley M.D.   On: 06/01/2013 10:02   Mr Abd W/wo Cm/mrcp  06/01/2013   CLINICAL DATA:  Worsening hepatic function.  EXAM: MRI ABDOMEN WITHOUT Jason [name] WITH CONTRAST (INCLUDING MRCP)  TECHNIQUE: Multiplanar multisequence MR imaging of the abdomen was performed both before Jason [name] after the administration of intravenous contrast. Heavily T2-weighted images of the  biliary Jason [name] pancreatic ducts were obtained, Jason [name] three-dimensional MRCP images were rendered by post processing.  CONTRAST:  64mL MULTIHANCE GADOBENATE DIMEGLUMINE 529 MG/ML IV SOLN  COMPARISON:  CT scan from 05/19/2013.  FINDINGS: There is some image degradation secondary to difficulty with breath holding. Compared to the previous CT scan, periportal edema persist. The intrahepatic biliary duct dilatation appears slightly progressed. Ducts in both the right Jason [name] left hepatic lobes are dilated, but there is no evidence of dilatation of the common hepatic duct. In fact, the common hepatic duct appears obliterated Jason [name] the intrahepatic bile ducts taper abruptly in the region of the left Jason [name] right hepatic duct confluence.  Postcontrast imaging shows a 2 x 4 cm ill-defined region of abnormal delayed enhancement in the central liver, involving Jason [name] obliterating the confluence of the left Jason [name] right hepatic ducts. This is best appreciated on image 45 of series 15.  There does appear to be mass effect on the bifurcation  of the left Jason [name] right portal veins, but no definite portal vein thrombus is identified. The hepatic veins opacify.  No other focal enhancing liver lesions are identified. The gallbladder is contracted with associated wall thickening.  No focal abnormality is seen in the spleen. The pancreas is normal. There is no dilatation of the main pancreatic duct. On the arterial phase imaging, there is a small focus of abnormal enhancement in the hepatoduodenal ligament which tracks along the expected course of the common duct. This is associated with abnormal signal in the central aspect of the hepatoduodenal ligament.  Small lymph nodes are seen in the hepatoduodenal ligament.  The adrenal glands are normal. The kidneys are normal. Moderate intra-abdominal ascites is evident. Numerous tiny foci of signal void throughout the visualized spine are compatible with the numerous tiny sclerotic lesions seen on the recent CT scan.  IMPRESSION: Periportal edema with associated dilatation of the left Jason [name] right intrahepatic bile ducts with obliteration of the bile duct confluence in the central liver. The common duct is also obliterated. These changes are associated with a 2 x 4 cm focus of ill-defined late enhancement at the level of the hepatic duct confluence on postcontrast imaging Jason [name] features are highly suggestive of a central obstructing mass lesion. Cholangiocarcinoma is a distinct consideration. Metastatic involvement is considered less likely.  Associated abnormal signal tracking in the hepatoduodenal ligament with a linear band of apparent enhancement along the expected course of the common duct. This may be related to enhancing tissue within the common duct.  No evidence for pancreatic head mass. There is no associated dilatation of the main pancreatic duct.  Interval progression of ascites, now moderate in volume.   Electronically Signed   By: Misty Stanley M.D.   On: 06/01/2013 10:02    Scheduled Meds: . aspirin  325  mg Oral Daily  . atorvastatin  40 mg Oral q1800  . enoxaparin (LOVENOX) injection  40 mg Subcutaneous QHS  . latanoprost  1 drop Both Eyes QHS  . ramipril  5 mg Oral Daily  . timolol  1 drop Both Eyes Daily   Continuous Infusions: . sodium chloride 125 mL/hr (06/01/13 1701)    Principal Problem:   Cancer of unknown origin Active Problems:   Jason [name] (coronary artery disease)   [name]   Hyperlipidemia   Hyperbilirubinemia   Elevated alkaline phosphatase level   Elevated liver enzymes   Bile duct obstruction   Severe protein-calorie malnutrition    Time spent: 40 minutes   Efe Fazzino, J  [facility] Pager [phone]. If 7PM-7AM, please contact night-coverage at [email or web address], password [facility] 06/02/2013, 10:24 AM  LOS: 2 days

## 2013-06-02 NOTE — Progress Notes (Signed)
   Mr. Sisneros had a paracentesis today. Cytology pending.  He has a hilar mass with biliary obstruction and a CA 19-9 level of 28K+. 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  ERCP unlikely to provide a tissue answer.  Other options are EUS and cholangioscopy. He won't get either of those tomorrow.  He feels ok - I suggest letting him go home tomorrow and we will work on arranging outpatient diagnostic evaluation. That is his desire. Palliative decompression of the biliary tree will not be easy, as Dr. Laurence Ferrari has indicated.  Gatha Mayer, MD, [facility] [phone] (pager) 06/02/2013 5:37 PM

## 2013-06-02 NOTE — Progress Notes (Signed)
Pt has a small bandaid on his Rt. LQ from the parencentisis. Small dot of blood on the bandaid. Appetite good ,visitors in all afternoon. Pt will probably be dc'd 1/2, and be treated as an outpt.

## 2013-06-02 NOTE — Plan of Care (Signed)
Problem: Phase I Progression Outcomes Goal: Initial discharge plan identified Outcome: Adequate for Discharge Probable dc 1/2

## 2013-06-02 NOTE — Procedures (Signed)
Successful US guided paracentesis from RLQ.  Yielded 900 ml of bright yellow fluid.  No immediate complications.  Pt tolerated well.   Specimen was sent for labs.  Tsosie Billing D PA-C 06/02/2013 9:26 AM

## 2013-06-02 NOTE — Progress Notes (Signed)
Subjective: The patient is seen and examined today. He is feeling fine with no specific complaints. He just came back to his room after ultrasound-guided paracentesis. He denied having any significant fever or chills. He has no nausea or vomiting or abdominal pain.  Objective: Vital signs in last 24 hours: Temp:  [97.7 F (36.5 C)-98.5 F (36.9 C)] 98.4 F (36.9 C) (01/01 0535) Pulse Rate:  [57-81] 81 (01/01 0535) Resp:  [16-20] 16 (01/01 0535) BP: (103-127)/(70-78) 103/78 mmHg (01/01 0918) SpO2:  [95 %-98 %] 98 % (01/01 0535)  Intake/Output from previous day: 12/31 0701 - 01/01 0700 In: 240 [P.O.:240] Out: 301 [Urine:300; Stool:1] Intake/Output this shift: Total I/O In: 240 [P.O.:240] Out: -   General appearance: alert, cooperative and no distress Resp: clear to auscultation bilaterally Cardio: regular rate and rhythm, S1, S2 normal, no murmur, click, rub or gallop GI: soft, non-tender; bowel sounds normal; no masses,  no organomegaly Extremities: extremities normal, atraumatic, no cyanosis or edema  Lab Results:   Recent Labs  05/31/13 1305 06/01/13 0452  WBC 6.0 6.2  HGB 16.9 14.8  HCT 48.1 41.9  PLT 188 189   BMET  Recent Labs  05/31/13 1306 06/01/13 0452  NA 136 136*  K 4.0 3.6*  CL  --  100  CO2 24 27  GLUCOSE 106 90  BUN 10.1 10  CREATININE 0.8 0.61  CALCIUM 9.2 8.5    Studies/Results: Dg Eye Foreign Body  06/01/2013   CLINICAL DATA:  79 year old male with history of metal exposure to the orbits and planned MRI. Initial encounter.  EXAM: ORBITS FOR FOREIGN BODY - 2 VIEW  COMPARISON:  None.  FINDINGS: There is no evidence of metallic foreign body within the orbits. No significant bone abnormality identified.  IMPRESSION: No evidence of metallic foreign body within the orbits.   Electronically Signed   By: Lars Pinks M.D.   On: 06/01/2013 07:18   Mr 3d Recon At Scanner  06/01/2013   CLINICAL DATA:  Worsening hepatic function.  EXAM: MRI ABDOMEN  WITHOUT AND WITH CONTRAST (INCLUDING MRCP)  TECHNIQUE: Multiplanar multisequence MR imaging of the abdomen was performed both before and after the administration of intravenous contrast. Heavily T2-weighted images of the biliary and pancreatic ducts were obtained, and three-dimensional MRCP images were rendered by post processing.  CONTRAST:  80mL MULTIHANCE GADOBENATE DIMEGLUMINE 529 MG/ML IV SOLN  COMPARISON:  CT scan from 05/19/2013.  FINDINGS: There is some image degradation secondary to difficulty with breath holding. Compared to the previous CT scan, periportal edema persist. The intrahepatic biliary duct dilatation appears slightly progressed. Ducts in both the right and left hepatic lobes are dilated, but there is no evidence of dilatation of the common hepatic duct. In fact, the common hepatic duct appears obliterated and the intrahepatic bile ducts taper abruptly in the region of the left and right hepatic duct confluence.  Postcontrast imaging shows a 2 x 4 cm ill-defined region of abnormal delayed enhancement in the central liver, involving and obliterating the confluence of the left and right hepatic ducts. This is best appreciated on image 45 of series 15.  There does appear to be mass effect on the bifurcation of the left and right portal veins, but no definite portal vein thrombus is identified. The hepatic veins opacify.  No other focal enhancing liver lesions are identified. The gallbladder is contracted with associated wall thickening.  No focal abnormality is seen in the spleen. The pancreas is normal. There is no dilatation of  the main pancreatic duct. On the arterial phase imaging, there is a small focus of abnormal enhancement in the hepatoduodenal ligament which tracks along the expected course of the common duct. This is associated with abnormal signal in the central aspect of the hepatoduodenal ligament.  Small lymph nodes are seen in the hepatoduodenal ligament.  The adrenal glands are  normal. The kidneys are normal. Moderate intra-abdominal ascites is evident. Numerous tiny foci of signal void throughout the visualized spine are compatible with the numerous tiny sclerotic lesions seen on the recent CT scan.  IMPRESSION: Periportal edema with associated dilatation of the left and right intrahepatic bile ducts with obliteration of the bile duct confluence in the central liver. The common duct is also obliterated. These changes are associated with a 2 x 4 cm focus of ill-defined late enhancement at the level of the hepatic duct confluence on postcontrast imaging and features are highly suggestive of a central obstructing mass lesion. Cholangiocarcinoma is a distinct consideration. Metastatic involvement is considered less likely.  Associated abnormal signal tracking in the hepatoduodenal ligament with a linear band of apparent enhancement along the expected course of the common duct. This may be related to enhancing tissue within the common duct.  No evidence for pancreatic head mass. There is no associated dilatation of the main pancreatic duct.  Interval progression of ascites, now moderate in volume.   Electronically Signed   By: Misty Stanley M.D.   On: 06/01/2013 10:02   Mr Abd W/wo Cm/mrcp  06/01/2013   CLINICAL DATA:  Worsening hepatic function.  EXAM: MRI ABDOMEN WITHOUT AND WITH CONTRAST (INCLUDING MRCP)  TECHNIQUE: Multiplanar multisequence MR imaging of the abdomen was performed both before and after the administration of intravenous contrast. Heavily T2-weighted images of the biliary and pancreatic ducts were obtained, and three-dimensional MRCP images were rendered by post processing.  CONTRAST:  66mL MULTIHANCE GADOBENATE DIMEGLUMINE 529 MG/ML IV SOLN  COMPARISON:  CT scan from 05/19/2013.  FINDINGS: There is some image degradation secondary to difficulty with breath holding. Compared to the previous CT scan, periportal edema persist. The intrahepatic biliary duct dilatation  appears slightly progressed. Ducts in both the right and left hepatic lobes are dilated, but there is no evidence of dilatation of the common hepatic duct. In fact, the common hepatic duct appears obliterated and the intrahepatic bile ducts taper abruptly in the region of the left and right hepatic duct confluence.  Postcontrast imaging shows a 2 x 4 cm ill-defined region of abnormal delayed enhancement in the central liver, involving and obliterating the confluence of the left and right hepatic ducts. This is best appreciated on image 45 of series 15.  There does appear to be mass effect on the bifurcation of the left and right portal veins, but no definite portal vein thrombus is identified. The hepatic veins opacify.  No other focal enhancing liver lesions are identified. The gallbladder is contracted with associated wall thickening.  No focal abnormality is seen in the spleen. The pancreas is normal. There is no dilatation of the main pancreatic duct. On the arterial phase imaging, there is a small focus of abnormal enhancement in the hepatoduodenal ligament which tracks along the expected course of the common duct. This is associated with abnormal signal in the central aspect of the hepatoduodenal ligament.  Small lymph nodes are seen in the hepatoduodenal ligament.  The adrenal glands are normal. The kidneys are normal. Moderate intra-abdominal ascites is evident. Numerous tiny foci of signal void throughout the  visualized spine are compatible with the numerous tiny sclerotic lesions seen on the recent CT scan.  IMPRESSION: Periportal edema with associated dilatation of the left and right intrahepatic bile ducts with obliteration of the bile duct confluence in the central liver. The common duct is also obliterated. These changes are associated with a 2 x 4 cm focus of ill-defined late enhancement at the level of the hepatic duct confluence on postcontrast imaging and features are highly suggestive of a central  obstructing mass lesion. Cholangiocarcinoma is a distinct consideration. Metastatic involvement is considered less likely.  Associated abnormal signal tracking in the hepatoduodenal ligament with a linear band of apparent enhancement along the expected course of the common duct. This may be related to enhancing tissue within the common duct.  No evidence for pancreatic head mass. There is no associated dilatation of the main pancreatic duct.  Interval progression of ascites, now moderate in volume.   Electronically Signed   By: Misty Stanley M.D.   On: 06/01/2013 10:02    Medications: I have reviewed the patient's current medications.  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 He underwent ultrasound-guided paracentesis with the hope to make a diagnosis from the cytology of the peritoneal fluid. The patient may benefit from ERCP by gastroenterology for tissue diagnosis. He has good performance status and may benefit from palliative chemotherapy if needed. I would see the patient back for follow up visit as previously scheduled on outpatient basis after having a tissue diagnosis for discussion of his treatment options. He would like to go home if there is no other procedure needed at this point. Thank you for taking good care of Mr. Captain. I will continue to follow up the patient with you and assist in his management on as-needed basis.   LOS: 2 days    Kawena Lyday K. 06/02/2013

## 2013-06-02 NOTE — H&P (Addendum)
Agree with PA history, and physical exam.  I met with Jason [name] today and discussed his situation.  The combination of his extremely elevated CA19-9 and his MRI/MRCP study are highly concerning and nearly diagnostic of locally advanced cholangiocarcinoma.  By MRCP, the tumor is consistent with a Bismuth 4 class lesion resulting in obstruction and amputation of multiple ductal segments just above the hilum.    This poses a very difficult scenario both for adequate drainage of bilirubin and for obtaining tissue diagnosis.    Luckily, despite his hyperbilirubinemia, Jason [name] remains asymptomatic without signs or symptoms of pruritis or cholangitis.  We performed a diagnostic paracentesis today in the hopes of attaining cells to confirm his diagnosis.  If this is not diagnostic, then a PTC could be considered although it would likely be very low yield in terms of attaining adequate tissue.    I discussed this challenging case with Dr. Carlean Purl yesterday at length.  ERCP has similar problems to PTC in terms of very low probability of a successful biopsy or brushing given the extent of the lesion in the CBD and intrahepatic ducts.  We discussed the possibility of EUS, however after re-reviewing the MRI/MRCP I think this would also be low yield.  There are no definitive or even suspicious upper abdominal nodes to target for biopsy.   Ultimately, if the paracentesis proves non diagnostic and Mr. Carlean Purl is a potential candidate for chemotherapy and a tissue diagnosis remains the only obstacle to treatment, then a PTC may be warranted in an effort to obtain a diagnosis.  In terms of his asymptomatic hyperbilirubinemia, I think it would take at least 2 and likely even more separate accesses to significantly lower his Br in the setting of multiple isolated biliary ductal segments.  I'm not sure there is any benefit to this given his current asymptomatic condition.    We will continue to follow and re-assess  after the paracentesis results are back.  Signed,  Criselda Peaches, MD [facility]

## 2013-06-03 ENCOUNTER — Telehealth: Payer: Self-pay | Admitting: Internal Medicine

## 2013-06-03 NOTE — Telephone Encounter (Signed)
Patient was advised to have follow up here in 4 days, but reading your consult note from 05/31/13, patient may need EUS.  What is the next step?

## 2013-06-03 NOTE — Progress Notes (Addendum)
Pt given discharge instructions, no new prescriptions were given. Pt verbalized understanding discharge instructions and follow-up appts. Pt stable at the time of discharge and did not want to ride down in a wheelchair to be discharged. However, wife convinced him to ride down in a wheelchair instead of walking down.

## 2013-06-03 NOTE — Progress Notes (Signed)
Nutrition Brief Note  Pt meets criteria for severe MALNUTRITION in the context of chronic illness as evidenced by <75% estimated energy intake for the past month with severe fluid accumulation with ascites (had 995m removed via paracentesis yesterday).  Patient identified on the Malnutrition Screening Tool (MST) Report  Wt Readings from Last 15 Encounters:  05/31/13 168 lb 3.2 oz (76.295 kg)  05/12/13 167 lb 6.4 oz (75.932 kg)    BMI of 23.5 kg/(m^2). Patient meets criteria for normal weight based on current BMI.   Current diet order is regular, patient is consuming approximately 100% of meals at this time. Labs and medications reviewed. Potassium slightly low, amylase, AST/ALT and total bilirubin all elevated. Admitted with weight loss, cough, and jaundice - found to have likely cholangiocarcinoma. Met with pt who reports eating only 1 meal/day for a "long time" with 40 pound unintended weight loss in the past 3.5 years. Appetite improved during admission. Unable to perform nutrition focused physical exam as pt anxious to be d/c, standing in doorway, waiting for d/c paperwork. Educated pt on high calorie/protein diet to prevent any further unintended weight loss. Handouts provided with teach back method used and RD contact information also provided. Nutritional supplements discussed.   No nutrition interventions warranted at this time. If nutrition issues arise, please consult RD.   HMikey CollegeMS, RBeckett Ridge LPenhookPager [phone] Hours Pager

## 2013-06-03 NOTE — Discharge Summary (Signed)
Physician Discharge Summary  GEOVANNIE VILAR ZYS:[id number] DOB: 12-Sep-1934 DOA: 05/31/2013  PCP: Provider Not In System  Admit date: 05/31/2013 Discharge date: 06/03/2013  Time spent:40 minutes  Recommendations for Outpatient Follow-up:   Cancer unknown origin  -Consulted GI as cancer most likely of GI origin. CA 19-9= 28,035.3, elevated amylase, elevated AST/ALT, total bilirubin= 21.13  -Patient obtain MRCP; see results below  -GI to perform ERCP vs Cutaneous Biliary Biopsy NOTE spoke with PA Nevin Bloodgood this a.m. 06/02/2013 and she is unsure if they will be able to obtain a biopsy. Stated Dr. Leroy Kennedy will be rounding this afternoon and will provide further guidance on if they will attempt to obtain a tissue sample or just wait for results from peritoneal fluid obtained this a.m.  -Counseled patient would be D/Ced today per [facility] note patient is to arrange followup as an outpatient. Will followup with Dr. Silvano Rusk. (Gastroenterology)  -Patient aware of that peritoneal fluid results can take up to 72 hours. Will address results at followup with GI. -Patient aware that secondary to the extensive nature of his tumor GI unsure they can perform a palliative decompression of the biliary tree; address at GI followup  HTN  -Currently within [facility] guidelines, continue current medication   HLD  -Continue home medication   Elevated alkaline phosphatase  -See cancer unknown origin   Elevated liver enzymes  -See cancer unknown origin    Discharge Diagnoses:  Principal Problem:   Cancer of unknown origin Active Problems:   CAD (coronary artery disease)   Hypertension   Hyperlipidemia   Hyperbilirubinemia   Elevated alkaline phosphatase level   Elevated liver enzymes   Bile duct obstruction   Severe protein-calorie malnutrition   Discharge Condition: Stable  Diet recommendation: Regular  There were no vitals filed for this visit.  History of present illness:  79 y.o. male BM PMHx  hypertension, dyslipidemia on statin, CAD S/P PCI, and tobacco abuse. He describes symptoms that include weight loss over approximately 3 years, approximately 20-40 pounds but difficult to quantify, he says. He initially intended to lose some extra weight by limiting his portions but then continued to lose weight unintentionally. He does complain of lack of appetite and some decrease in PO intake. He endorses darkened urine, which now appears cola colored. Additionally, he endorses some lightening/acholic stools over several months. The patient denies fevers or chills. He has a chronic cough which is productive of scant mucoid sputum. He describes his functional capacity as good, being able to walk 1 mile without fatiguing. He lives at home with his wife and an older family member.  The patient was recently seen by Dr. Tamala Julian for general evaluation. Presumably after that visit it was discovered that he had hyperbilirubinemia to 10.3, ALP 591, AST 170, and ALT 103. He underwent CT scan of the chest that revealed RUL scar-like opacitifcation 5.8 x 1.3 x 1.5 cm. There was mild-mod intrahepatic biliary ductal diltation but no CBD dil. Additionally there was mention of bony lesions possibly consistent with metastatic disease. He was evaluated by Dr. Curt Bears 06/01/2013 patient states negative abdominal pain, negative back pain, negative N./V. negative SOB negative CP. 06/02/2013 patient sitting comfortably on the edge of the bed, states negative back pain, negative CP, negative SOB, negative abdominal pain. Patient would like to be discharged today if no biopsy is going to be performed, because he understands that the results from the paracentesis from this a.m. will take 48-72 hours. 06/03/2013 patient has been cleared  by gastroenterology to be discharged with followup to be arranged for next week. Patient aware that they will discuss results of the paracentesis, and decide if they can perform a palliative  decompression of the biliary tree.    Consultants:  Dr. Silvano Rusk (Gastroenterology)    Procedures:  06/02/2013 PA Tsosie Billing performed US guided paracentesis from RLQ.  Yielded 900 ml of bright yellow fluid.   06/01/2013 MRCP  Periportal edema with associated dilatation of the left and right  intrahepatic bile ducts with obliteration of the bile duct  confluence in the central liver. The common duct is also  obliterated. These changes are associated with a 2 x 4 cm focus of  ill-defined late enhancement at the level of the hepatic duct  confluence on postcontrast imaging and features are highly  suggestive of a central obstructing mass lesion. Cholangiocarcinoma  is a distinct consideration. Metastatic involvement is considered  less likely.  Associated abnormal signal tracking in the hepatoduodenal ligament  with a linear band of apparent enhancement along the expected course  of the common duct. This may be related to enhancing tissue within  the common duct.  No evidence for pancreatic head mass. There is no associated  dilatation of the main pancreatic duct.  Interval progression of ascites, now moderate in volume.   CT abdomen pelvis with contrast 05/16/2013  1. Nonspecific area of scar-like consolidation is noted in the right  upper lobe. This is favored to be the sequela of chronic infection  or inflammation.  2. Diffuse multifocal sclerotic bone lesions are identified  worrisome for metastatic disease.  CT abdomen and pelvis:  1. Intrahepatic bile duct dilatation with normal caliber common bile  duct. In a patient who has elevated liver function tests I cannot  exclude centrally obstructing lesion. Consider further evaluation  with ERCP or MRCP.  2. Abdominal and pelvic ascites  3. Diffuse sclerotic bone lesions identified worrisome for  widespread metastatic disease.  Discharge Exam: Filed Vitals:   06/02/13 1317 06/02/13 1405 06/02/13 2201 06/03/13 0548   BP: 120/75 121/71 99/57 118/74  Pulse:  77 69 79  Temp:  97.6 F (36.4 C) 97.5 F (36.4 C) 98.7 F (37.1 C)  TempSrc:  Oral Oral Oral  Resp:  16 18 14   SpO2:  96% 96% 93%   General: A./O. x4, NAD, icteric eyes  Cardiovascular: Regular rate, negative murmurs rubs or  Respiratory: Clear to auscultation bilateral  Abdomen: Soft, mildly distended, nontender, plus bowel sounds  Musculoskeletal: Negative pedal edema, plaque lesions on bilateral shins   Discharge Instructions   Future Appointments Provider Department Dept Phone   06/15/2013 8:45 AM [facility] [place] [phone]   06/15/2013 9:15 AM Curt Bears, MD [place] [phone]       Medication List    ASK your doctor about these medications       aspirin 325 MG tablet  Take 325 mg by mouth daily.     latanoprost 0.005 % ophthalmic solution  Commonly known as:  XALATAN  Place 1 drop into both eyes at bedtime.     nitroGLYCERIN 0.4 MG SL tablet  Commonly known as:  NITROSTAT  Place 0.4 mg under the tongue every 5 (five) minutes as needed for chest pain.     ramipril 5 MG capsule  Commonly known as:  ALTACE  Take 5 mg by mouth daily.     rosuvastatin 20 MG tablet  Commonly  known as:  CRESTOR  Take 20 mg by mouth daily.     sildenafil 50 MG tablet  Commonly known as:  VIAGRA  Take 50 mg by mouth daily as needed for erectile dysfunction.     timolol 0.5 % ophthalmic solution  Commonly known as:  TIMOPTIC  Place 1 drop into both eyes daily.       No Known Allergies    The results of significant diagnostics from this hospitalization (including imaging, microbiology, ancillary and laboratory) are listed below for reference.    Significant Diagnostic Studies: Dg Eye Foreign Body  06/01/2013   CLINICAL DATA:  79 year old male with history of metal exposure to the orbits and planned MRI. Initial encounter.  EXAM: ORBITS FOR  FOREIGN BODY - 2 VIEW  COMPARISON:  None.  FINDINGS: There is no evidence of metallic foreign body within the orbits. No significant bone abnormality identified.  IMPRESSION: No evidence of metallic foreign body within the orbits.   Electronically Signed   By: Lars Pinks M.D.   On: 06/01/2013 07:18   Dg Chest 2 View  05/12/2013   CLINICAL DATA:  Tobacco use and hypertension.  EXAM: CHEST  2 VIEW  COMPARISON:  None.  FINDINGS: The cardiomediastinal silhouette is within normal limits. The lungs are mildly hyperinflated. Interstitial markings are mildly prominent diffusely. There is an approximately 1 cm elongated focal opacity in the right upper lobe. Patch, somewhat ill-defined opacities are also present in the left upper lobe and right lower lobe. There is no evidence of pleural effusion or pneumothorax. Multiple old left-sided rib fractures are present.  IMPRESSION: Hyperinflation with scattered, ill-defined patchy opacities in both upper lobes and right lower lobe. While these could reflect the sequelae of current or past infection, given patient's smoking history underlying malignancy is not excluded. If available, comparison with any prior chest radiographs would be helpful to assess stability. Otherwise, consider chest CT for further evaluation.   Electronically Signed   By: Logan Bores   On: 05/12/2013 14:51   Ct Chest W Contrast  05/19/2013   CLINICAL DATA:  Evaluate for metastatic disease.  EXAM: CT CHEST, ABDOMEN, AND PELVIS WITH CONTRAST  TECHNIQUE: Multidetector CT imaging of the chest, abdomen and pelvis was performed following the standard protocol during bolus administration of intravenous contrast.  CONTRAST:  138mL OMNIPAQUE IOHEXOL 300 MG/ML  SOLN  COMPARISON:  None.  FINDINGS: CT CHEST FINDINGS  No pleural effusion identified. Advanced changes of centrilobular and paraseptal emphysema identified. Right upper lobe scar like opacity containing a few punctate calcifications is noted measuring  5.8 x 1.3 by 1.5 cm, image 18/series 4. There is a left upper lobe anterior subpleural spiculated density which likely represents scar, image 15/ series 4.  The trachea os stress set the trachea appears patent and is midline.  The heart size is normal.  No pericardial effusion.  There is no enlarged mediastinal or hilar lymph nodes identified.  Coronary artery calcifications are identified including RCA, LAD and left circumflex arteries. Mild calcified atherosclerotic change also involves the thoracic aorta. No enlarged axillary or supraclavicular adenopathy.  Review of the visualized osseous structures demonstrates innumerable sclerotic lesions throughout the bony thorax. Most of these measure less than 1 cm and are worrisome for bone metastases.  CT ABDOMEN AND PELVIS FINDINGS  There is no focal liver abnormality. There is a mild to moderate intrahepatic bile duct dilatation. No common bile duct dilatation. No obstructing mass noted. Gallbladder appears collapsed. Common bile duct has  a normal caliber. Normal appearance of the pancreas. The spleen is negative.  The adrenal glands both appear normal. Both kidneys are on unremarkable.  The urinary bladder appears normal. Prostate gland is enlarged and has a transverse dimension of 5.9 cm.  Normal caliber of the abdominal aorta. There is no upper abdominal adenopathy noted. No pelvic or inguinal adenopathy noted. Moderate ascites is identified within the abdomen and pelvis.  Review of the visualized osseous structures is significant for diffuse sclerotic bone metastasis. No pathologic fractures identified.  IMPRESSION: CT chest:  1. Nonspecific area of scar-like consolidation is noted in the right upper lobe. This is favored to be the sequela of chronic infection or inflammation. 2. Diffuse multifocal sclerotic bone lesions are identified worrisome for metastatic disease. CT abdomen and pelvis:  1. Intrahepatic bile duct dilatation with normal caliber common bile  duct. In a patient who has elevated liver function tests I cannot exclude centrally obstructing lesion. Consider further evaluation with ERCP or MRCP. 2. Abdominal and pelvic ascites 3. Diffuse sclerotic bone lesions identified worrisome for widespread metastatic disease.   Electronically Signed   By: Kerby Moors M.D.   On: 05/19/2013 14:11   Ct Abdomen Pelvis W Contrast  05/19/2013   CLINICAL DATA:  Evaluate for metastatic disease.  EXAM: CT CHEST, ABDOMEN, AND PELVIS WITH CONTRAST  TECHNIQUE: Multidetector CT imaging of the chest, abdomen and pelvis was performed following the standard protocol during bolus administration of intravenous contrast.  CONTRAST:  174mL OMNIPAQUE IOHEXOL 300 MG/ML  SOLN  COMPARISON:  None.  FINDINGS: CT CHEST FINDINGS  No pleural effusion identified. Advanced changes of centrilobular and paraseptal emphysema identified. Right upper lobe scar like opacity containing a few punctate calcifications is noted measuring 5.8 x 1.3 by 1.5 cm, image 18/series 4. There is a left upper lobe anterior subpleural spiculated density which likely represents scar, image 15/ series 4.  The trachea os stress set the trachea appears patent and is midline.  The heart size is normal.  No pericardial effusion.  There is no enlarged mediastinal or hilar lymph nodes identified.  Coronary artery calcifications are identified including RCA, LAD and left circumflex arteries. Mild calcified atherosclerotic change also involves the thoracic aorta. No enlarged axillary or supraclavicular adenopathy.  Review of the visualized osseous structures demonstrates innumerable sclerotic lesions throughout the bony thorax. Most of these measure less than 1 cm and are worrisome for bone metastases.  CT ABDOMEN AND PELVIS FINDINGS  There is no focal liver abnormality. There is a mild to moderate intrahepatic bile duct dilatation. No common bile duct dilatation. No obstructing mass noted. Gallbladder appears collapsed.  Common bile duct has a normal caliber. Normal appearance of the pancreas. The spleen is negative.  The adrenal glands both appear normal. Both kidneys are on unremarkable.  The urinary bladder appears normal. Prostate gland is enlarged and has a transverse dimension of 5.9 cm.  Normal caliber of the abdominal aorta. There is no upper abdominal adenopathy noted. No pelvic or inguinal adenopathy noted. Moderate ascites is identified within the abdomen and pelvis.  Review of the visualized osseous structures is significant for diffuse sclerotic bone metastasis. No pathologic fractures identified.  IMPRESSION: CT chest:  1. Nonspecific area of scar-like consolidation is noted in the right upper lobe. This is favored to be the sequela of chronic infection or inflammation. 2. Diffuse multifocal sclerotic bone lesions are identified worrisome for metastatic disease. CT abdomen and pelvis:  1. Intrahepatic bile duct dilatation with normal caliber  common bile duct. In a patient who has elevated liver function tests I cannot exclude centrally obstructing lesion. Consider further evaluation with ERCP or MRCP. 2. Abdominal and pelvic ascites 3. Diffuse sclerotic bone lesions identified worrisome for widespread metastatic disease.   Electronically Signed   By: Kerby Moors M.D.   On: 05/19/2013 14:11   Mr 3d Recon At Scanner  06/01/2013   CLINICAL DATA:  Worsening hepatic function.  EXAM: MRI ABDOMEN WITHOUT AND WITH CONTRAST (INCLUDING MRCP)  TECHNIQUE: Multiplanar multisequence MR imaging of the abdomen was performed both before and after the administration of intravenous contrast. Heavily T2-weighted images of the biliary and pancreatic ducts were obtained, and three-dimensional MRCP images were rendered by post processing.  CONTRAST:  27mL MULTIHANCE GADOBENATE DIMEGLUMINE 529 MG/ML IV SOLN  COMPARISON:  CT scan from 05/19/2013.  FINDINGS: There is some image degradation secondary to difficulty with breath holding.  Compared to the previous CT scan, periportal edema persist. The intrahepatic biliary duct dilatation appears slightly progressed. Ducts in both the right and left hepatic lobes are dilated, but there is no evidence of dilatation of the common hepatic duct. In fact, the common hepatic duct appears obliterated and the intrahepatic bile ducts taper abruptly in the region of the left and right hepatic duct confluence.  Postcontrast imaging shows a 2 x 4 cm ill-defined region of abnormal delayed enhancement in the central liver, involving and obliterating the confluence of the left and right hepatic ducts. This is best appreciated on image 45 of series 15.  There does appear to be mass effect on the bifurcation of the left and right portal veins, but no definite portal vein thrombus is identified. The hepatic veins opacify.  No other focal enhancing liver lesions are identified. The gallbladder is contracted with associated wall thickening.  No focal abnormality is seen in the spleen. The pancreas is normal. There is no dilatation of the main pancreatic duct. On the arterial phase imaging, there is a small focus of abnormal enhancement in the hepatoduodenal ligament which tracks along the expected course of the common duct. This is associated with abnormal signal in the central aspect of the hepatoduodenal ligament.  Small lymph nodes are seen in the hepatoduodenal ligament.  The adrenal glands are normal. The kidneys are normal. Moderate intra-abdominal ascites is evident. Numerous tiny foci of signal void throughout the visualized spine are compatible with the numerous tiny sclerotic lesions seen on the recent CT scan.  IMPRESSION: Periportal edema with associated dilatation of the left and right intrahepatic bile ducts with obliteration of the bile duct confluence in the central liver. The common duct is also obliterated. These changes are associated with a 2 x 4 cm focus of ill-defined late enhancement at the level  of the hepatic duct confluence on postcontrast imaging and features are highly suggestive of a central obstructing mass lesion. Cholangiocarcinoma is a distinct consideration. Metastatic involvement is considered less likely.  Associated abnormal signal tracking in the hepatoduodenal ligament with a linear band of apparent enhancement along the expected course of the common duct. This may be related to enhancing tissue within the common duct.  No evidence for pancreatic head mass. There is no associated dilatation of the main pancreatic duct.  Interval progression of ascites, now moderate in volume.   Electronically Signed   By: Misty Stanley M.D.   On: 06/01/2013 10:02   Mr Abd W/wo Cm/mrcp  06/01/2013   CLINICAL DATA:  Worsening hepatic function.  EXAM: MRI ABDOMEN  WITHOUT AND WITH CONTRAST (INCLUDING MRCP)  TECHNIQUE: Multiplanar multisequence MR imaging of the abdomen was performed both before and after the administration of intravenous contrast. Heavily T2-weighted images of the biliary and pancreatic ducts were obtained, and three-dimensional MRCP images were rendered by post processing.  CONTRAST:  25mL MULTIHANCE GADOBENATE DIMEGLUMINE 529 MG/ML IV SOLN  COMPARISON:  CT scan from 05/19/2013.  FINDINGS: There is some image degradation secondary to difficulty with breath holding. Compared to the previous CT scan, periportal edema persist. The intrahepatic biliary duct dilatation appears slightly progressed. Ducts in both the right and left hepatic lobes are dilated, but there is no evidence of dilatation of the common hepatic duct. In fact, the common hepatic duct appears obliterated and the intrahepatic bile ducts taper abruptly in the region of the left and right hepatic duct confluence.  Postcontrast imaging shows a 2 x 4 cm ill-defined region of abnormal delayed enhancement in the central liver, involving and obliterating the confluence of the left and right hepatic ducts. This is best appreciated on  image 45 of series 15.  There does appear to be mass effect on the bifurcation of the left and right portal veins, but no definite portal vein thrombus is identified. The hepatic veins opacify.  No other focal enhancing liver lesions are identified. The gallbladder is contracted with associated wall thickening.  No focal abnormality is seen in the spleen. The pancreas is normal. There is no dilatation of the main pancreatic duct. On the arterial phase imaging, there is a small focus of abnormal enhancement in the hepatoduodenal ligament which tracks along the expected course of the common duct. This is associated with abnormal signal in the central aspect of the hepatoduodenal ligament.  Small lymph nodes are seen in the hepatoduodenal ligament.  The adrenal glands are normal. The kidneys are normal. Moderate intra-abdominal ascites is evident. Numerous tiny foci of signal void throughout the visualized spine are compatible with the numerous tiny sclerotic lesions seen on the recent CT scan.  IMPRESSION: Periportal edema with associated dilatation of the left and right intrahepatic bile ducts with obliteration of the bile duct confluence in the central liver. The common duct is also obliterated. These changes are associated with a 2 x 4 cm focus of ill-defined late enhancement at the level of the hepatic duct confluence on postcontrast imaging and features are highly suggestive of a central obstructing mass lesion. Cholangiocarcinoma is a distinct consideration. Metastatic involvement is considered less likely.  Associated abnormal signal tracking in the hepatoduodenal ligament with a linear band of apparent enhancement along the expected course of the common duct. This may be related to enhancing tissue within the common duct.  No evidence for pancreatic head mass. There is no associated dilatation of the main pancreatic duct.  Interval progression of ascites, now moderate in volume.   Electronically Signed   By:  Misty Stanley M.D.   On: 06/01/2013 10:02    Microbiology: No results found for this or any previous visit (from the past 240 hour(s)).   Labs: Basic Metabolic Panel:  Recent Labs Lab 05/31/13 1306 06/01/13 0452  NA 136 136*  K 4.0 3.6*  CL  --  100  CO2 24 27  GLUCOSE 106 90  BUN 10.1 10  CREATININE 0.8 0.61  CALCIUM 9.2 8.5   Liver Function Tests:  Recent Labs Lab 05/31/13 1306 06/01/13 0452  AST 231* 196*  ALT 132* 105*  ALKPHOS 866* 702*  BILITOT 21.13* 18.0*  PROT 7.6  5.9*  ALBUMIN 2.2* 1.9*    Recent Labs Lab 06/01/13 0904  LIPASE 36  AMYLASE 106*   No results found for this basename: AMMONIA,  in the last 168 hours CBC:  Recent Labs Lab 05/31/13 1305 06/01/13 0452  WBC 6.0 6.2  NEUTROABS 4.4  --   HGB 16.9 14.8  HCT 48.1 41.9  MCV 92.3 92.7  PLT 188 189   Cardiac Enzymes: No results found for this basename: CKTOTAL, CKMB, CKMBINDEX, TROPONINI,  in the last 168 hours BNP: BNP (last 3 results) No results found for this basename: PROBNP,  in the last 8760 hours CBG: No results found for this basename: GLUCAP,  in the last 168 hours     Signed:  Dia Crawford, MD [facility] [phone] pager

## 2013-06-03 NOTE — Telephone Encounter (Signed)
His cytology is looking + so prob will not need EUS Does not need an office visit I will call when final results in

## 2013-06-03 NOTE — Telephone Encounter (Signed)
Patient advised.

## 2013-06-03 NOTE — Plan of Care (Signed)
Problem: Discharge Progression Outcomes Goal: Discharge plan in place and appropriate Outcome: Completed/Met Date Met:  06/03/13 Pt discharged to wife and going home

## 2013-06-07 NOTE — Progress Notes (Signed)
Quick Note:  I spoke to wife and told her cytology on ascites confirmed cancer - as we suspected. She will; tell husband. He has f/u Dr. Julien Nordmann 1/14.  ______

## 2013-06-14 ENCOUNTER — Other Ambulatory Visit: Payer: Self-pay | Admitting: *Deleted

## 2013-06-14 DIAGNOSIS — C801 Malignant (primary) neoplasm, unspecified: Secondary | ICD-10-CM

## 2013-06-15 ENCOUNTER — Telehealth: Payer: Self-pay | Admitting: Internal Medicine

## 2013-06-15 ENCOUNTER — Other Ambulatory Visit (HOSPITAL_BASED_OUTPATIENT_CLINIC_OR_DEPARTMENT_OTHER): Payer: Medicare Other

## 2013-06-15 ENCOUNTER — Telehealth: Payer: Self-pay | Admitting: *Deleted

## 2013-06-15 ENCOUNTER — Ambulatory Visit (HOSPITAL_BASED_OUTPATIENT_CLINIC_OR_DEPARTMENT_OTHER): Payer: Medicare Other | Admitting: Internal Medicine

## 2013-06-15 ENCOUNTER — Encounter: Payer: Self-pay | Admitting: Internal Medicine

## 2013-06-15 VITALS — BP 115/74 | HR 64 | Temp 97.0°F | Resp 18 | Ht 71.0 in | Wt 183.2 lb

## 2013-06-15 DIAGNOSIS — C801 Malignant (primary) neoplasm, unspecified: Secondary | ICD-10-CM

## 2013-06-15 DIAGNOSIS — C221 Intrahepatic bile duct carcinoma: Secondary | ICD-10-CM | POA: Insufficient documentation

## 2013-06-15 DIAGNOSIS — I10 Essential (primary) hypertension: Secondary | ICD-10-CM

## 2013-06-15 DIAGNOSIS — R180 Malignant ascites: Secondary | ICD-10-CM

## 2013-06-15 DIAGNOSIS — I2510 Atherosclerotic heart disease of native coronary artery without angina pectoris: Secondary | ICD-10-CM

## 2013-06-15 LAB — COMPREHENSIVE METABOLIC PANEL (CC13)
ALT: 115 U/L — AB (ref 0–55)
AST: 235 U/L — AB (ref 5–34)
Albumin: 1.8 g/dL — ABNORMAL LOW (ref 3.5–5.0)
Alkaline Phosphatase: 865 U/L — ABNORMAL HIGH (ref 40–150)
Anion Gap: 11 mEq/L (ref 3–11)
BUN: 17.4 mg/dL (ref 7.0–26.0)
CO2: 27 mEq/L (ref 22–29)
Calcium: 9 mg/dL (ref 8.4–10.4)
Chloride: 97 mEq/L — ABNORMAL LOW (ref 98–109)
Creatinine: 1 mg/dL (ref 0.7–1.3)
Glucose: 100 mg/dl (ref 70–140)
Potassium: 4.5 mEq/L (ref 3.5–5.1)
Sodium: 135 mEq/L — ABNORMAL LOW (ref 136–145)
Total Bilirubin: 25.87 mg/dL — ABNORMAL HIGH (ref 0.20–1.20)
Total Protein: 6.9 g/dL (ref 6.4–8.3)

## 2013-06-15 LAB — CBC WITH DIFFERENTIAL/PLATELET
BASO%: 1 % (ref 0.0–2.0)
Basophils Absolute: 0.1 10*3/uL (ref 0.0–0.1)
EOS%: 3.7 % (ref 0.0–7.0)
Eosinophils Absolute: 0.3 10*3/uL (ref 0.0–0.5)
HEMATOCRIT: 42.9 % (ref 38.4–49.9)
HGB: 15.2 g/dL (ref 13.0–17.1)
LYMPH%: 10.3 % — ABNORMAL LOW (ref 14.0–49.0)
MCH: 32.5 pg (ref 27.2–33.4)
MCHC: 35.4 g/dL (ref 32.0–36.0)
MCV: 91.7 fL (ref 79.3–98.0)
MONO#: 0.6 10*3/uL (ref 0.1–0.9)
MONO%: 8.8 % (ref 0.0–14.0)
NEUT#: 5.2 10*3/uL (ref 1.5–6.5)
NEUT%: 76.2 % — AB (ref 39.0–75.0)
PLATELETS: 222 10*3/uL (ref 140–400)
RBC: 4.68 10*6/uL (ref 4.20–5.82)
RDW: 21.9 % — ABNORMAL HIGH (ref 11.0–14.6)
WBC: 6.8 10*3/uL (ref 4.0–10.3)
lymph#: 0.7 10*3/uL — ABNORMAL LOW (ref 0.9–3.3)

## 2013-06-15 NOTE — Telephone Encounter (Signed)
Per staff message and POF I have scheduled appts.  JMW  

## 2013-06-15 NOTE — Progress Notes (Signed)
[place] [phone]   [phone]  OFFICE PROGRESS NOTE  Provider Not In System No address on file  DIAGNOSIS: Metastatic cholangiocarcinoma diagnosed in January of 2015  PRIOR THERAPY: None  CURRENT THERAPY: Systemic chemotherapy with cisplatin 70 mg/M2 on day 1 and gemcitabine 1000 mg/M2 on days 1 and 8 every 3 weeks. First dose 06/21/2013.  INTERVAL HISTORY: Jason [name] 79 y.o. male returns to the clinic today for followup visit accompanied by his wife. The patient was seen for initial evaluation on 05/31/2013 when he presented with elevated hepatic enzymes and CT scan of the abdomen showed suspicious lesion for cholangiocarcinoma. The patient was recently admitted to [facility] with worsening hepatic dysfunction. During his admission he underwent ultrasound-guided paracentesis by interventional radiology and the final pathology was consistent with metastatic adenocarcinoma, most likely secondary to cholangiocarcinoma. He was also seen by gastroenterology during his admission and recommended against ERCP because most of the biliary obstruction was intrahepatic. The patient continues to do fine with no specific complaints except for jaundice. He denied having any significant nausea or vomiting, no abdominal pain, no change in bowel movement. He has no chest pain, shortness of breath, cough or hemoptysis. He lost a few pounds recently. He is here today for evaluation and discussion of his treatment options.  MEDICAL HISTORY: Past Medical History  Diagnosis Date  . CAD (coronary artery disease)     , acute IMI  . Hypertension   . Hyperlipidemia     ALLERGIES:  has No Known Allergies.  MEDICATIONS:  Current Outpatient Prescriptions  Medication Sig Dispense Refill  . aspirin 325 MG tablet Take 325 mg by mouth daily.      Marland Kitchen latanoprost (XALATAN) 0.005 % ophthalmic solution Place 1 drop into both eyes at bedtime.       .  nitroGLYCERIN (NITROSTAT) 0.4 MG SL tablet Place 0.4 mg under the tongue every 5 (five) minutes as needed for chest pain.      . ramipril (ALTACE) 5 MG capsule Take 5 mg by mouth daily.      . sildenafil (VIAGRA) 50 MG tablet Take 50 mg by mouth daily as needed for erectile dysfunction.      . timolol (TIMOPTIC) 0.5 % ophthalmic solution Place 1 drop into both eyes daily.        No current facility-administered medications for this visit.    SURGICAL HISTORY:  Past Surgical History  Procedure Laterality Date  . Heart stint    . Cataracts      REVIEW OF SYSTEMS:  Constitutional: positive for fatigue Eyes: positive for icterus Ears, nose, mouth, throat, and face: negative Respiratory: negative Cardiovascular: negative Gastrointestinal: negative Genitourinary:negative Integument/breast: negative Hematologic/lymphatic: negative Musculoskeletal:negative Neurological: negative Behavioral/Psych: negative Endocrine: negative Allergic/Immunologic: negative   PHYSICAL EXAMINATION: General appearance: alert, cooperative, icteric and no distress Head: Normocephalic, without obvious abnormality, atraumatic Neck: no adenopathy, no JVD, supple, symmetrical, trachea midline and thyroid not enlarged, symmetric, no tenderness/mass/nodules Lymph nodes: Cervical, supraclavicular, and axillary nodes normal. Resp: clear to auscultation bilaterally Back: symmetric, no curvature. ROM normal. No CVA tenderness. Cardio: regular rate and rhythm, S1, S2 normal, no murmur, click, rub or gallop GI: soft, non-tender; bowel sounds normal; no masses,  no organomegaly Extremities: extremities normal, atraumatic, no cyanosis or edema Neurologic: Alert and oriented X 3, normal strength and tone. Normal symmetric reflexes. Normal coordination and gait  ECOG PERFORMANCE STATUS: 1 - Symptomatic but completely ambulatory  Blood pressure 115/74,  pulse 64, temperature 97 F (36.1 C), temperature source Oral, resp.  rate 18, height 5\' 11"  (1.803 m), weight 183 lb 3.2 oz (83.099 kg), SpO2 97.00%.  LABORATORY DATA: Lab Results  Component Value Date   WBC 6.8 06/15/2013   HGB 15.2 06/15/2013   HCT 42.9 06/15/2013   MCV 91.7 06/15/2013   PLT 222 06/15/2013      Chemistry      Component Value Date/Time   NA 136* 06/01/2013 0452   NA 136 05/31/2013 1306   K 3.6* 06/01/2013 0452   K 4.0 05/31/2013 1306   CL 100 06/01/2013 0452   CO2 27 06/01/2013 0452   CO2 24 05/31/2013 1306   BUN 10 06/01/2013 0452   BUN 10.1 05/31/2013 1306   CREATININE 0.61 06/01/2013 0452   CREATININE 0.8 05/31/2013 1306      Component Value Date/Time   CALCIUM 8.5 06/01/2013 0452   CALCIUM 9.2 05/31/2013 1306   ALKPHOS 702* 06/01/2013 0452   ALKPHOS 866* 05/31/2013 1306   AST 196* 06/01/2013 0452   AST 231* 05/31/2013 1306   ALT 105* 06/01/2013 0452   ALT 132* 05/31/2013 1306   BILITOT 18.0* 06/01/2013 0452   BILITOT 21.13* 05/31/2013 1306       RADIOGRAPHIC STUDIES: Dg Eye Foreign Body  06/01/2013   CLINICAL DATA:  79 year old male with history of metal exposure to the orbits and planned MRI. Initial encounter.  EXAM: ORBITS FOR FOREIGN BODY - 2 VIEW  COMPARISON:  None.  FINDINGS: There is no evidence of metallic foreign body within the orbits. No significant bone abnormality identified.  IMPRESSION: No evidence of metallic foreign body within the orbits.   Electronically Signed   By: Lars Pinks M.D.   On: 06/01/2013 07:18   Ct Chest W Contrast  05/19/2013   CLINICAL DATA:  Evaluate for metastatic disease.  EXAM: CT CHEST, ABDOMEN, AND PELVIS WITH CONTRAST  TECHNIQUE: Multidetector CT imaging of the chest, abdomen and pelvis was performed following the standard protocol during bolus administration of intravenous contrast.  CONTRAST:  123mL OMNIPAQUE IOHEXOL 300 MG/ML  SOLN  COMPARISON:  None.  FINDINGS: CT CHEST FINDINGS  No pleural effusion identified. Advanced changes of centrilobular and paraseptal emphysema  identified. Right upper lobe scar like opacity containing a few punctate calcifications is noted measuring 5.8 x 1.3 by 1.5 cm, image 18/series 4. There is a left upper lobe anterior subpleural spiculated density which likely represents scar, image 15/ series 4.  The trachea os stress set the trachea appears patent and is midline.  The heart size is normal.  No pericardial effusion.  There is no enlarged mediastinal or hilar lymph nodes identified.  Coronary artery calcifications are identified including RCA, LAD and left circumflex arteries. Mild calcified atherosclerotic change also involves the thoracic aorta. No enlarged axillary or supraclavicular adenopathy.  Review of the visualized osseous structures demonstrates innumerable sclerotic lesions throughout the bony thorax. Most of these measure less than 1 cm and are worrisome for bone metastases.  CT ABDOMEN AND PELVIS FINDINGS  There is no focal liver abnormality. There is a mild to moderate intrahepatic bile duct dilatation. No common bile duct dilatation. No obstructing mass noted. Gallbladder appears collapsed. Common bile duct has a normal caliber. Normal appearance of the pancreas. The spleen is negative.  The adrenal glands both appear normal. Both kidneys are on unremarkable.  The urinary bladder appears normal. Prostate gland is enlarged and has a transverse dimension of 5.9 cm.  Normal caliber  of the abdominal aorta. There is no upper abdominal adenopathy noted. No pelvic or inguinal adenopathy noted. Moderate ascites is identified within the abdomen and pelvis.  Review of the visualized osseous structures is significant for diffuse sclerotic bone metastasis. No pathologic fractures identified.  IMPRESSION: CT chest:  1. Nonspecific area of scar-like consolidation is noted in the right upper lobe. This is favored to be the sequela of chronic infection or inflammation. 2. Diffuse multifocal sclerotic bone lesions are identified worrisome for  metastatic disease. CT abdomen and pelvis:  1. Intrahepatic bile duct dilatation with normal caliber common bile duct. In a patient who has elevated liver function tests I cannot exclude centrally obstructing lesion. Consider further evaluation with ERCP or MRCP. 2. Abdominal and pelvic ascites 3. Diffuse sclerotic bone lesions identified worrisome for widespread metastatic disease.   Electronically Signed   By: Kerby Moors M.D.   On: 05/19/2013 14:11   Ct Abdomen Pelvis W Contrast  05/19/2013   CLINICAL DATA:  Evaluate for metastatic disease.  EXAM: CT CHEST, ABDOMEN, AND PELVIS WITH CONTRAST  TECHNIQUE: Multidetector CT imaging of the chest, abdomen and pelvis was performed following the standard protocol during bolus administration of intravenous contrast.  CONTRAST:  137mL OMNIPAQUE IOHEXOL 300 MG/ML  SOLN  COMPARISON:  None.  FINDINGS: CT CHEST FINDINGS  No pleural effusion identified. Advanced changes of centrilobular and paraseptal emphysema identified. Right upper lobe scar like opacity containing a few punctate calcifications is noted measuring 5.8 x 1.3 by 1.5 cm, image 18/series 4. There is a left upper lobe anterior subpleural spiculated density which likely represents scar, image 15/ series 4.  The trachea os stress set the trachea appears patent and is midline.  The heart size is normal.  No pericardial effusion.  There is no enlarged mediastinal or hilar lymph nodes identified.  Coronary artery calcifications are identified including RCA, LAD and left circumflex arteries. Mild calcified atherosclerotic change also involves the thoracic aorta. No enlarged axillary or supraclavicular adenopathy.  Review of the visualized osseous structures demonstrates innumerable sclerotic lesions throughout the bony thorax. Most of these measure less than 1 cm and are worrisome for bone metastases.  CT ABDOMEN AND PELVIS FINDINGS  There is no focal liver abnormality. There is a mild to moderate intrahepatic  bile duct dilatation. No common bile duct dilatation. No obstructing mass noted. Gallbladder appears collapsed. Common bile duct has a normal caliber. Normal appearance of the pancreas. The spleen is negative.  The adrenal glands both appear normal. Both kidneys are on unremarkable.  The urinary bladder appears normal. Prostate gland is enlarged and has a transverse dimension of 5.9 cm.  Normal caliber of the abdominal aorta. There is no upper abdominal adenopathy noted. No pelvic or inguinal adenopathy noted. Moderate ascites is identified within the abdomen and pelvis.  Review of the visualized osseous structures is significant for diffuse sclerotic bone metastasis. No pathologic fractures identified.  IMPRESSION: CT chest:  1. Nonspecific area of scar-like consolidation is noted in the right upper lobe. This is favored to be the sequela of chronic infection or inflammation. 2. Diffuse multifocal sclerotic bone lesions are identified worrisome for metastatic disease. CT abdomen and pelvis:  1. Intrahepatic bile duct dilatation with normal caliber common bile duct. In a patient who has elevated liver function tests I cannot exclude centrally obstructing lesion. Consider further evaluation with ERCP or MRCP. 2. Abdominal and pelvic ascites 3. Diffuse sclerotic bone lesions identified worrisome for widespread metastatic disease.   Electronically Signed  By: Kerby Moors M.D.   On: 05/19/2013 14:11   Mr 3d Recon At Scanner  06/01/2013   CLINICAL DATA:  Worsening hepatic function.  EXAM: MRI ABDOMEN WITHOUT AND WITH CONTRAST (INCLUDING MRCP)  TECHNIQUE: Multiplanar multisequence MR imaging of the abdomen was performed both before and after the administration of intravenous contrast. Heavily T2-weighted images of the biliary and pancreatic ducts were obtained, and three-dimensional MRCP images were rendered by post processing.  CONTRAST:  40mL MULTIHANCE GADOBENATE DIMEGLUMINE 529 MG/ML IV SOLN  COMPARISON:  CT  scan from 05/19/2013.  FINDINGS: There is some image degradation secondary to difficulty with breath holding. Compared to the previous CT scan, periportal edema persist. The intrahepatic biliary duct dilatation appears slightly progressed. Ducts in both the right and left hepatic lobes are dilated, but there is no evidence of dilatation of the common hepatic duct. In fact, the common hepatic duct appears obliterated and the intrahepatic bile ducts taper abruptly in the region of the left and right hepatic duct confluence.  Postcontrast imaging shows a 2 x 4 cm ill-defined region of abnormal delayed enhancement in the central liver, involving and obliterating the confluence of the left and right hepatic ducts. This is best appreciated on image 45 of series 15.  There does appear to be mass effect on the bifurcation of the left and right portal veins, but no definite portal vein thrombus is identified. The hepatic veins opacify.  No other focal enhancing liver lesions are identified. The gallbladder is contracted with associated wall thickening.  No focal abnormality is seen in the spleen. The pancreas is normal. There is no dilatation of the main pancreatic duct. On the arterial phase imaging, there is a small focus of abnormal enhancement in the hepatoduodenal ligament which tracks along the expected course of the common duct. This is associated with abnormal signal in the central aspect of the hepatoduodenal ligament.  Small lymph nodes are seen in the hepatoduodenal ligament.  The adrenal glands are normal. The kidneys are normal. Moderate intra-abdominal ascites is evident. Numerous tiny foci of signal void throughout the visualized spine are compatible with the numerous tiny sclerotic lesions seen on the recent CT scan.  IMPRESSION: Periportal edema with associated dilatation of the left and right intrahepatic bile ducts with obliteration of the bile duct confluence in the central liver. The common duct is also  obliterated. These changes are associated with a 2 x 4 cm focus of ill-defined late enhancement at the level of the hepatic duct confluence on postcontrast imaging and features are highly suggestive of a central obstructing mass lesion. Cholangiocarcinoma is a distinct consideration. Metastatic involvement is considered less likely.  Associated abnormal signal tracking in the hepatoduodenal ligament with a linear band of apparent enhancement along the expected course of the common duct. This may be related to enhancing tissue within the common duct.  No evidence for pancreatic head mass. There is no associated dilatation of the main pancreatic duct.  Interval progression of ascites, now moderate in volume.   Electronically Signed   By: Misty Stanley M.D.   On: 06/01/2013 10:02   US Paracentesis  06/03/2013   CLINICAL DATA:  Obstructive jaundice, ascites, request for diagnostic paracentesis.  EXAM: ULTRASOUND GUIDED PARACENTESIS  TECHNIQUE: Survey ultrasound of the abdomen was performed and an appropriate skin entry site in the RLQ abdomen was selected. Skin site was marked, prepped with Betadine, and draped in usual sterile fashion, and infiltrated locally with 1% lidocaine. A 5 [place] multisidehole  Yueh sheath needle was advanced into the peritoneal space until fluid could be aspirated. The sheath was advanced and the needle removed. 900 ml of bright yellow ascites were aspirated. No immediate complication. Fluid was sent for cytology.  IMPRESSION: Technically successful ultrasound guided paracentesis, removing 900 ml of ascites.  Read By:  Tsosie Billing PA-C   Electronically Signed   By: Arne Cleveland M.D.   On: 06/03/2013 10:19   Mr Abd W/wo Cm/mrcp  06/01/2013   CLINICAL DATA:  Worsening hepatic function.  EXAM: MRI ABDOMEN WITHOUT AND WITH CONTRAST (INCLUDING MRCP)  TECHNIQUE: Multiplanar multisequence MR imaging of the abdomen was performed both before and after the administration of intravenous  contrast. Heavily T2-weighted images of the biliary and pancreatic ducts were obtained, and three-dimensional MRCP images were rendered by post processing.  CONTRAST:  32mL MULTIHANCE GADOBENATE DIMEGLUMINE 529 MG/ML IV SOLN  COMPARISON:  CT scan from 05/19/2013.  FINDINGS: There is some image degradation secondary to difficulty with breath holding. Compared to the previous CT scan, periportal edema persist. The intrahepatic biliary duct dilatation appears slightly progressed. Ducts in both the right and left hepatic lobes are dilated, but there is no evidence of dilatation of the common hepatic duct. In fact, the common hepatic duct appears obliterated and the intrahepatic bile ducts taper abruptly in the region of the left and right hepatic duct confluence.  Postcontrast imaging shows a 2 x 4 cm ill-defined region of abnormal delayed enhancement in the central liver, involving and obliterating the confluence of the left and right hepatic ducts. This is best appreciated on image 45 of series 15.  There does appear to be mass effect on the bifurcation of the left and right portal veins, but no definite portal vein thrombus is identified. The hepatic veins opacify.  No other focal enhancing liver lesions are identified. The gallbladder is contracted with associated wall thickening.  No focal abnormality is seen in the spleen. The pancreas is normal. There is no dilatation of the main pancreatic duct. On the arterial phase imaging, there is a small focus of abnormal enhancement in the hepatoduodenal ligament which tracks along the expected course of the common duct. This is associated with abnormal signal in the central aspect of the hepatoduodenal ligament.  Small lymph nodes are seen in the hepatoduodenal ligament.  The adrenal glands are normal. The kidneys are normal. Moderate intra-abdominal ascites is evident. Numerous tiny foci of signal void throughout the visualized spine are compatible with the numerous tiny  sclerotic lesions seen on the recent CT scan.  IMPRESSION: Periportal edema with associated dilatation of the left and right intrahepatic bile ducts with obliteration of the bile duct confluence in the central liver. The common duct is also obliterated. These changes are associated with a 2 x 4 cm focus of ill-defined late enhancement at the level of the hepatic duct confluence on postcontrast imaging and features are highly suggestive of a central obstructing mass lesion. Cholangiocarcinoma is a distinct consideration. Metastatic involvement is considered less likely.  Associated abnormal signal tracking in the hepatoduodenal ligament with a linear band of apparent enhancement along the expected course of the common duct. This may be related to enhancing tissue within the common duct.  No evidence for pancreatic head mass. There is no associated dilatation of the main pancreatic duct.  Interval progression of ascites, now moderate in volume.   Electronically Signed   By: Misty Stanley M.D.   On: 06/01/2013 10:02    ASSESSMENT AND  PLAN: This is a very pleasant 79 years old African American male recently diagnosed with metastatic adenocarcinoma highly suspicious for cholangiocarcinoma with significant biliary obstruction and liver dysfunction. I have a lengthy discussion with the patient and his wife today about his current disease stage, prognosis and treatment options. I indicated to the patient that he has incurable condition and treatment will be of palliative nature. I gave the patient the option of systemic chemotherapy with cisplatin and gemcitabine versus palliative care and hospice referral. I discussed with the patient adverse effect of this treatment including but not limited to alopecia, myelosuppression, nausea and vomiting, peripheral neuropathy, liver or renal dysfunction. He is interested in proceeding with the systemic therapy and I expect the patient to start the first cycle of this treatment  on 06/21/2013. He would have a chemotherapy education class before starting the first cycle of his treatment. The patient would come back for followup visit in 2 weeks for reevaluation and management any adverse effect of his treatment. I will continue his pharmacy with prescription for Compazine 10 mg by mouth every 6 hours as needed for nausea. He was advised to call immediately if he has any concerning symptoms in the interval.  The patient voices understanding of current disease status and treatment options and is in agreement with the current care plan.  All questions were answered. The patient knows to call the clinic with any problems, questions or concerns. We can certainly see the patient much sooner if necessary.  I spent 20 minutes counseling the patient face to face. The total time spent in the appointment was 30 minutes.  Disclaimer: This note was dictated with voice recognition software. Similar sounding words can inadvertently be transcribed and may not be corrected upon review.

## 2013-06-15 NOTE — Telephone Encounter (Signed)
gave pt appt for labs,md and chemo for January 2015

## 2013-06-15 NOTE — Patient Instructions (Signed)
Smoking Cessation Quitting smoking is important to your health and has many advantages. However, it is not always easy to quit since nicotine is a very addictive drug. Often times, people try 3 times or more before being able to quit. This document explains the best ways for you to prepare to quit smoking. Quitting takes hard work and a lot of effort, but you can do it. ADVANTAGES OF QUITTING SMOKING  You will live longer, feel better, and live better.  Your body will feel the impact of quitting smoking almost immediately.  Within 20 minutes, blood pressure decreases. Your pulse returns to its normal level.  After 8 hours, carbon monoxide levels in the blood return to normal. Your oxygen level increases.  After 24 hours, the chance of having a heart attack starts to decrease. Your breath, hair, and body stop smelling like smoke.  After 48 hours, damaged nerve endings begin to recover. Your sense of taste and smell improve.  After 72 hours, the body is virtually free of nicotine. Your bronchial tubes relax and breathing becomes easier.  After 2 to 12 weeks, lungs can hold more air. Exercise becomes easier and circulation improves.  The risk of having a heart attack, stroke, cancer, or lung disease is greatly reduced.  After 1 year, the risk of coronary heart disease is cut in half.  After 5 years, the risk of stroke falls to the same as a nonsmoker.  After 10 years, the risk of lung cancer is cut in half and the risk of other cancers decreases significantly.  After 15 years, the risk of coronary heart disease drops, usually to the level of a nonsmoker.  If you are pregnant, quitting smoking will improve your chances of having a healthy baby.  The people you live with, especially any children, will be healthier.  You will have extra money to spend on things other than cigarettes. QUESTIONS TO THINK ABOUT BEFORE ATTEMPTING TO QUIT You may want to talk about your answers with your  caregiver.  Why do you want to quit?  If you tried to quit in the past, what helped and what did not?  What will be the most difficult situations for you after you quit? How will you plan to handle them?  Who can help you through the tough times? Your family? Friends? A caregiver?  What pleasures do you get from smoking? What ways can you still get pleasure if you quit? Here are some questions to ask your caregiver:  How can you help me to be successful at quitting?  What medicine do you think would be best for me and how should I take it?  What should I do if I need more help?  What is smoking withdrawal like? How can I get information on withdrawal? GET READY  Set a quit date.  Change your environment by getting rid of all cigarettes, ashtrays, matches, and lighters in your home, car, or work. Do not let people smoke in your home.  Review your past attempts to quit. Think about what worked and what did not. GET SUPPORT AND ENCOURAGEMENT You have a better chance of being successful if you have help. You can get support in many ways.  Tell your family, friends, and co-workers that you are going to quit and need their support. Ask them not to smoke around you.  Get individual, group, or telephone counseling and support. Programs are available at local hospitals and health centers. Call your local health department for   information about programs in your area.  Spiritual beliefs and practices may help some smokers quit.  Download a "quit meter" on your computer to keep track of quit statistics, such as how long you have gone without smoking, cigarettes not smoked, and money saved.  Get a self-help book about quitting smoking and staying off of tobacco. LEARN NEW SKILLS AND BEHAVIORS  Distract yourself from urges to smoke. Talk to someone, go for a walk, or occupy your time with a task.  Change your normal routine. Take a different route to work. Drink tea instead of coffee.  Eat breakfast in a different place.  Reduce your stress. Take a hot bath, exercise, or read a book.  Plan something enjoyable to do every day. Reward yourself for not smoking.  Explore interactive web-based programs that specialize in helping you quit. GET MEDICINE AND USE IT CORRECTLY Medicines can help you stop smoking and decrease the urge to smoke. Combining medicine with the above behavioral methods and support can greatly increase your chances of successfully quitting smoking.  Nicotine replacement therapy helps deliver nicotine to your body without the negative effects and risks of smoking. Nicotine replacement therapy includes nicotine gum, lozenges, inhalers, nasal sprays, and skin patches. Some may be available over-the-counter and others require a prescription.  Antidepressant medicine helps people abstain from smoking, but how this works is unknown. This medicine is available by prescription.  Nicotinic receptor partial agonist medicine simulates the effect of nicotine in your brain. This medicine is available by prescription. Ask your caregiver for advice about which medicines to use and how to use them based on your health history. Your caregiver will tell you what side effects to look out for if you choose to be on a medicine or therapy. Carefully read the information on the package. Do not use any other product containing nicotine while using a nicotine replacement product.  RELAPSE OR DIFFICULT SITUATIONS Most relapses occur within the first 3 months after quitting. Do not be discouraged if you start smoking again. Remember, most people try several times before finally quitting. You may have symptoms of withdrawal because your body is used to nicotine. You may crave cigarettes, be irritable, feel very hungry, cough often, get headaches, or have difficulty concentrating. The withdrawal symptoms are only temporary. They are strongest when you first quit, but they will go away within  10 14 days. To reduce the chances of relapse, try to:  Avoid drinking alcohol. Drinking lowers your chances of successfully quitting.  Reduce the amount of caffeine you consume. Once you quit smoking, the amount of caffeine in your body increases and can give you symptoms, such as a rapid heartbeat, sweating, and anxiety.  Avoid smokers because they can make you want to smoke.  Do not let weight gain distract you. Many smokers will gain weight when they quit, usually less than 10 pounds. Eat a healthy diet and stay active. You can always lose the weight gained after you quit.  Find ways to improve your mood other than smoking. FOR MORE INFORMATION  www.smokefree.gov  Document Released: 05/13/2001 Document Revised: 11/18/2011 Document Reviewed: 08/28/2011 [employer] Patient Information 2014 [employer].  

## 2013-06-16 ENCOUNTER — Other Ambulatory Visit: Payer: Self-pay | Admitting: *Deleted

## 2013-06-16 ENCOUNTER — Encounter: Payer: Self-pay | Admitting: *Deleted

## 2013-06-16 ENCOUNTER — Encounter: Payer: Self-pay | Admitting: Internal Medicine

## 2013-06-16 ENCOUNTER — Other Ambulatory Visit: Payer: Medicare Other

## 2013-06-16 DIAGNOSIS — C801 Malignant (primary) neoplasm, unspecified: Secondary | ICD-10-CM

## 2013-06-16 MED ORDER — PROCHLORPERAZINE MALEATE 10 MG PO TABS: 10.0000 mg | ORAL_TABLET | Freq: Four times a day (QID) | ORAL | Status: AC | PRN

## 2013-06-16 NOTE — Progress Notes (Signed)
Put daughter's fmla form on nurse's desk.

## 2013-06-17 ENCOUNTER — Encounter: Payer: Self-pay | Admitting: Internal Medicine

## 2013-06-17 NOTE — Progress Notes (Signed)
Faxed daughter's fmla form to Zebedee Iba @ [phone].

## 2013-06-21 ENCOUNTER — Other Ambulatory Visit: Payer: Medicare Other

## 2013-06-22 ENCOUNTER — Other Ambulatory Visit (HOSPITAL_BASED_OUTPATIENT_CLINIC_OR_DEPARTMENT_OTHER): Payer: Medicare Other

## 2013-06-22 ENCOUNTER — Ambulatory Visit (HOSPITAL_BASED_OUTPATIENT_CLINIC_OR_DEPARTMENT_OTHER): Payer: Medicare Other

## 2013-06-22 VITALS — BP 108/76 | HR 66 | Temp 96.8°F | Resp 20

## 2013-06-22 DIAGNOSIS — C221 Intrahepatic bile duct carcinoma: Secondary | ICD-10-CM

## 2013-06-22 DIAGNOSIS — C7952 Secondary malignant neoplasm of bone marrow: Secondary | ICD-10-CM

## 2013-06-22 DIAGNOSIS — C801 Malignant (primary) neoplasm, unspecified: Secondary | ICD-10-CM

## 2013-06-22 DIAGNOSIS — Z5111 Encounter for antineoplastic chemotherapy: Secondary | ICD-10-CM

## 2013-06-22 DIAGNOSIS — C7951 Secondary malignant neoplasm of bone: Secondary | ICD-10-CM

## 2013-06-22 LAB — CBC WITH DIFFERENTIAL/PLATELET
BASO%: 0.9 % (ref 0.0–2.0)
BASOS ABS: 0.1 10*3/uL (ref 0.0–0.1)
EOS ABS: 0.2 10*3/uL (ref 0.0–0.5)
EOS%: 3 % (ref 0.0–7.0)
HEMATOCRIT: 41.3 % (ref 38.4–49.9)
HGB: 15.2 g/dL (ref 13.0–17.1)
LYMPH#: 0.8 10*3/uL — AB (ref 0.9–3.3)
LYMPH%: 10.4 % — ABNORMAL LOW (ref 14.0–49.0)
MCH: 32.7 pg (ref 27.2–33.4)
MCHC: 36.8 g/dL — ABNORMAL HIGH (ref 32.0–36.0)
MCV: 88.8 fL (ref 79.3–98.0)
MONO#: 0.9 10*3/uL (ref 0.1–0.9)
MONO%: 11.6 % (ref 0.0–14.0)
NEUT#: 5.9 10*3/uL (ref 1.5–6.5)
NEUT%: 74.1 % (ref 39.0–75.0)
Platelets: 225 10*3/uL (ref 140–400)
RBC: 4.65 10*6/uL (ref 4.20–5.82)
RDW: 22.3 % — ABNORMAL HIGH (ref 11.0–14.6)
WBC: 8 10*3/uL (ref 4.0–10.3)
nRBC: 0 % (ref 0–0)

## 2013-06-22 LAB — COMPREHENSIVE METABOLIC PANEL (CC13)
ALK PHOS: 798 U/L — AB (ref 40–150)
ALT: 120 U/L — ABNORMAL HIGH (ref 0–55)
AST: 251 U/L — AB (ref 5–34)
Albumin: 1.7 g/dL — ABNORMAL LOW (ref 3.5–5.0)
Anion Gap: 9 mEq/L (ref 3–11)
BILIRUBIN TOTAL: 25.86 mg/dL — AB (ref 0.20–1.20)
BUN: 18.8 mg/dL (ref 7.0–26.0)
CO2: 26 mEq/L (ref 22–29)
Calcium: 8.8 mg/dL (ref 8.4–10.4)
Chloride: 94 mEq/L — ABNORMAL LOW (ref 98–109)
Creatinine: 1.1 mg/dL (ref 0.7–1.3)
GLUCOSE: 121 mg/dL (ref 70–140)
POTASSIUM: 4.3 meq/L (ref 3.5–5.1)
Sodium: 128 mEq/L — ABNORMAL LOW (ref 136–145)
TOTAL PROTEIN: 6.3 g/dL — AB (ref 6.4–8.3)

## 2013-06-22 LAB — TECHNOLOGIST REVIEW

## 2013-06-22 MED ORDER — SODIUM CHLORIDE 0.9 % IV SOLN
800.0000 mg/m2 | Freq: Once | INTRAVENOUS | Status: AC
  Administered 2013-06-22: 1634 mg via INTRAVENOUS
  Filled 2013-06-22: qty 42.98

## 2013-06-22 MED ORDER — SODIUM CHLORIDE 0.9 % IV SOLN
150.0000 mg | Freq: Once | INTRAVENOUS | Status: AC
  Administered 2013-06-22: 150 mg via INTRAVENOUS
  Filled 2013-06-22: qty 5

## 2013-06-22 MED ORDER — POTASSIUM CHLORIDE 2 MEQ/ML IV SOLN
Freq: Once | INTRAVENOUS | Status: AC
  Administered 2013-06-22: 10:00:00 via INTRAVENOUS
  Filled 2013-06-22: qty 10

## 2013-06-22 MED ORDER — PALONOSETRON HCL INJECTION 0.25 MG/5ML
INTRAVENOUS | Status: AC
  Filled 2013-06-22: qty 5

## 2013-06-22 MED ORDER — DEXAMETHASONE SODIUM PHOSPHATE 20 MG/5ML IJ SOLN
12.0000 mg | Freq: Once | INTRAMUSCULAR | Status: AC
  Administered 2013-06-22: 12 mg via INTRAVENOUS

## 2013-06-22 MED ORDER — PALONOSETRON HCL INJECTION 0.25 MG/5ML
0.2500 mg | Freq: Once | INTRAVENOUS | Status: AC
  Administered 2013-06-22: 0.25 mg via INTRAVENOUS

## 2013-06-22 MED ORDER — SODIUM CHLORIDE 0.9 % IV SOLN
100.0000 mg/m2 | Freq: Once | INTRAVENOUS | Status: AC
  Administered 2013-06-22: 204 mg via INTRAVENOUS
  Filled 2013-06-22: qty 204

## 2013-06-22 MED ORDER — DEXAMETHASONE SODIUM PHOSPHATE 20 MG/5ML IJ SOLN
INTRAMUSCULAR | Status: AC
  Filled 2013-06-22: qty 5

## 2013-06-22 MED ORDER — SODIUM CHLORIDE 0.9 % IV SOLN
Freq: Once | INTRAVENOUS | Status: AC
  Administered 2013-06-22: 10:00:00 via INTRAVENOUS

## 2013-06-22 NOTE — Patient Instructions (Signed)
[facility] Discharge Instructions for Patients Receiving Chemotherapy  Today you received the following chemotherapy agents Gemzar and Cisplatin.  To help prevent nausea and vomiting after your treatment, we encourage you to take your nausea medication as prescribed.   If you develop nausea and vomiting that is not controlled by your nausea medication, call the clinic.   BELOW ARE SYMPTOMS THAT SHOULD BE REPORTED IMMEDIATELY:  *FEVER GREATER THAN 100.5 F  *CHILLS WITH OR WITHOUT FEVER  NAUSEA AND VOMITING THAT IS NOT CONTROLLED WITH YOUR NAUSEA MEDICATION  *UNUSUAL SHORTNESS OF BREATH  *UNUSUAL BRUISING OR BLEEDING  TENDERNESS IN MOUTH AND THROAT WITH OR WITHOUT PRESENCE OF ULCERS  *URINARY PROBLEMS  *BOWEL PROBLEMS  UNUSUAL RASH Items with * indicate a potential emergency and should be followed up as soon as possible.  Feel free to call the clinic you have any questions or concerns. The clinic phone number is [phone].

## 2013-06-22 NOTE — Progress Notes (Signed)
Pre Cisplatin void was 400 mls.

## 2013-06-23 ENCOUNTER — Telehealth: Payer: Self-pay | Admitting: *Deleted

## 2013-06-23 NOTE — Telephone Encounter (Signed)
Section for chemotherapy F/U.  Patient is doing well.  Denies n/v.  Denies any new side effects or symptoms.  Bowel and bladder is functioning well with LBM today.  Eating small amounts and will drink Ensure this afternoon.  "I just don't have an appetite."  Drinking well and I instructed to drink 64 oz minimum daily or at least the day before, of and after treatment.  Denies questions at this time and encouraged to call if needed.  Reviewed how to call after hours in the case of an emergency.

## 2013-06-23 NOTE — Telephone Encounter (Signed)
Message copied by Cherylynn Ridges on Thu Jun 23, 2013  3:32 PM ------      Message from: Amelia Jo I      Created: Wed Jun 22, 2013  4:11 PM      Regarding: follow up call       First time Gemzar and Cisplatin. No reaction. Dr. Julien Nordmann. Patient request to be called in the morning. Earlier the better. Call cell phone first [phone]. Call home second # is [phone]. ------

## 2013-06-24 NOTE — Progress Notes (Signed)
[place] [facility] Psychosocial Distress Screening Clinical Social Work  Clinical Social Work was referred by distress screening protocol.  The patient scored a 5 on the Psychosocial Distress Thermometer which indicates moderate distress. Clinical Social Worker Intern phoned to assess for distress and other psychosocial needs. Patient stated that he had no questions or concerns at this time.  Patient is aware of services and agrees to seek out further assistance if needed.   Clinical Social Worker follow up needed: no  If yes, follow up plan:   Lindsi Bayliss S. [place] Work Intern [employer] [phone]

## 2013-06-25 ENCOUNTER — Inpatient Hospital Stay (HOSPITAL_COMMUNITY)
Admission: EM | Admit: 2013-06-25 | Discharge: 2013-07-03 | DRG: 435 | Disposition: E | Payer: Medicare Other | Attending: Internal Medicine | Admitting: Internal Medicine

## 2013-06-25 ENCOUNTER — Encounter (HOSPITAL_COMMUNITY): Payer: Self-pay | Admitting: Emergency Medicine

## 2013-06-25 ENCOUNTER — Other Ambulatory Visit: Payer: Self-pay

## 2013-06-25 ENCOUNTER — Emergency Department (HOSPITAL_COMMUNITY): Payer: Medicare Other

## 2013-06-25 DIAGNOSIS — J9600 Acute respiratory failure, unspecified whether with hypoxia or hypercapnia: Secondary | ICD-10-CM | POA: Diagnosis present

## 2013-06-25 DIAGNOSIS — N179 Acute kidney failure, unspecified: Secondary | ICD-10-CM | POA: Diagnosis present

## 2013-06-25 DIAGNOSIS — D701 Agranulocytosis secondary to cancer chemotherapy: Secondary | ICD-10-CM

## 2013-06-25 DIAGNOSIS — Z66 Do not resuscitate: Secondary | ICD-10-CM | POA: Diagnosis present

## 2013-06-25 DIAGNOSIS — Z7982 Long term (current) use of aspirin: Secondary | ICD-10-CM

## 2013-06-25 DIAGNOSIS — I252 Old myocardial infarction: Secondary | ICD-10-CM

## 2013-06-25 DIAGNOSIS — E860 Dehydration: Secondary | ICD-10-CM

## 2013-06-25 DIAGNOSIS — R748 Abnormal levels of other serum enzymes: Secondary | ICD-10-CM

## 2013-06-25 DIAGNOSIS — D62 Acute posthemorrhagic anemia: Secondary | ICD-10-CM | POA: Diagnosis present

## 2013-06-25 DIAGNOSIS — R64 Cachexia: Secondary | ICD-10-CM | POA: Diagnosis present

## 2013-06-25 DIAGNOSIS — K922 Gastrointestinal hemorrhage, unspecified: Secondary | ICD-10-CM

## 2013-06-25 DIAGNOSIS — D696 Thrombocytopenia, unspecified: Secondary | ICD-10-CM | POA: Diagnosis present

## 2013-06-25 DIAGNOSIS — I10 Essential (primary) hypertension: Secondary | ICD-10-CM | POA: Diagnosis present

## 2013-06-25 DIAGNOSIS — Z79899 Other long term (current) drug therapy: Secondary | ICD-10-CM

## 2013-06-25 DIAGNOSIS — I2510 Atherosclerotic heart disease of native coronary artery without angina pectoris: Secondary | ICD-10-CM | POA: Diagnosis present

## 2013-06-25 DIAGNOSIS — E43 Unspecified severe protein-calorie malnutrition: Secondary | ICD-10-CM | POA: Diagnosis present

## 2013-06-25 DIAGNOSIS — E871 Hypo-osmolality and hyponatremia: Secondary | ICD-10-CM | POA: Diagnosis present

## 2013-06-25 DIAGNOSIS — F17200 Nicotine dependence, unspecified, uncomplicated: Secondary | ICD-10-CM | POA: Diagnosis present

## 2013-06-25 DIAGNOSIS — T451X5A Adverse effect of antineoplastic and immunosuppressive drugs, initial encounter: Secondary | ICD-10-CM | POA: Diagnosis present

## 2013-06-25 DIAGNOSIS — C221 Intrahepatic bile duct carcinoma: Principal | ICD-10-CM | POA: Diagnosis present

## 2013-06-25 DIAGNOSIS — Z515 Encounter for palliative care: Secondary | ICD-10-CM

## 2013-06-25 DIAGNOSIS — E785 Hyperlipidemia, unspecified: Secondary | ICD-10-CM | POA: Diagnosis present

## 2013-06-25 DIAGNOSIS — E875 Hyperkalemia: Secondary | ICD-10-CM | POA: Diagnosis present

## 2013-06-25 DIAGNOSIS — D702 Other drug-induced agranulocytosis: Secondary | ICD-10-CM | POA: Diagnosis present

## 2013-06-25 DIAGNOSIS — K831 Obstruction of bile duct: Secondary | ICD-10-CM | POA: Diagnosis present

## 2013-06-25 HISTORY — DX: Intrahepatic bile duct carcinoma: C22.1

## 2013-06-25 LAB — COMPREHENSIVE METABOLIC PANEL
ALBUMIN: 1.7 g/dL — AB (ref 3.5–5.2)
ALT: 117 U/L — AB (ref 0–53)
AST: 236 U/L — AB (ref 0–37)
Alkaline Phosphatase: 602 U/L — ABNORMAL HIGH (ref 39–117)
BUN: 81 mg/dL — ABNORMAL HIGH (ref 6–23)
CALCIUM: 8.1 mg/dL — AB (ref 8.4–10.5)
CO2: 22 meq/L (ref 19–32)
CREATININE: 1.64 mg/dL — AB (ref 0.50–1.35)
Chloride: 90 mEq/L — ABNORMAL LOW (ref 96–112)
GFR calc Af Amer: 45 mL/min — ABNORMAL LOW (ref >90)
GFR, EST NON AFRICAN AMERICAN: 39 mL/min — AB (ref >90)
Glucose, Bld: 132 mg/dL — ABNORMAL HIGH (ref 70–99)
Potassium: 5.6 mEq/L — ABNORMAL HIGH (ref 3.7–5.3)
SODIUM: 127 meq/L — AB (ref 137–147)
TOTAL PROTEIN: 5.6 g/dL — AB (ref 6.0–8.3)
Total Bilirubin: 23 mg/dL (ref 0.3–1.2)

## 2013-06-25 LAB — URINALYSIS, ROUTINE W REFLEX MICROSCOPIC
Glucose, UA: NEGATIVE mg/dL
Hgb urine dipstick: NEGATIVE
Ketones, ur: NEGATIVE mg/dL
LEUKOCYTES UA: NEGATIVE
Nitrite: NEGATIVE
PH: 5.5 (ref 5.0–8.0)
Protein, ur: NEGATIVE mg/dL
SPECIFIC GRAVITY, URINE: 1.013 (ref 1.005–1.030)
Urobilinogen, UA: 1 mg/dL (ref 0.0–1.0)

## 2013-06-25 LAB — CBC
HCT: 31 % — ABNORMAL LOW (ref 39.0–52.0)
Hemoglobin: 11.1 g/dL — ABNORMAL LOW (ref 13.0–17.0)
MCH: 32.1 pg (ref 26.0–34.0)
MCHC: 35.8 g/dL (ref 30.0–36.0)
MCV: 89.6 fL (ref 78.0–100.0)
Platelets: 214 10*3/uL (ref 150–400)
RBC: 3.46 MIL/uL — ABNORMAL LOW (ref 4.22–5.81)
RDW: 21.8 % — AB (ref 11.5–15.5)
WBC: 6.8 10*3/uL (ref 4.0–10.5)

## 2013-06-25 LAB — PROTIME-INR
INR: 1.3 (ref 0.00–1.49)
Prothrombin Time: 15.9 seconds — ABNORMAL HIGH (ref 11.6–15.2)

## 2013-06-25 LAB — ABO/RH: ABO/RH(D): O NEG

## 2013-06-25 LAB — TYPE AND SCREEN
ABO/RH(D): O NEG
Antibody Screen: NEGATIVE

## 2013-06-25 LAB — OCCULT BLOOD, POC DEVICE: Fecal Occult Bld: POSITIVE — AB

## 2013-06-25 LAB — CG4 I-STAT (LACTIC ACID): Lactic Acid, Venous: 4.31 mmol/L — ABNORMAL HIGH (ref 0.5–2.2)

## 2013-06-25 LAB — AMMONIA

## 2013-06-25 MED ORDER — SODIUM CHLORIDE 0.9 % IJ SOLN
3.0000 mL | Freq: Two times a day (BID) | INTRAMUSCULAR | Status: DC
  Administered 2013-06-26: 3 mL via INTRAVENOUS

## 2013-06-25 MED ORDER — SODIUM CHLORIDE 0.9 % IV BOLUS (SEPSIS)
1000.0000 mL | Freq: Once | INTRAVENOUS | Status: AC
  Administered 2013-06-25: 1000 mL via INTRAVENOUS

## 2013-06-25 MED ORDER — SODIUM CHLORIDE 0.9 % IV SOLN: INTRAVENOUS | Status: DC

## 2013-06-25 MED ORDER — SODIUM POLYSTYRENE SULFONATE 15 GM/60ML PO SUSP: 45.0000 g | Freq: Once | ORAL | Status: DC

## 2013-06-25 MED ORDER — SODIUM CHLORIDE 0.9 % IV SOLN
INTRAVENOUS | Status: DC
  Administered 2013-06-25 – 2013-06-28 (×4): via INTRAVENOUS

## 2013-06-25 NOTE — ED Notes (Signed)
Pt left with [facility]

## 2013-06-25 NOTE — ED Notes (Signed)
Dr Tawnya Crook made aware of abnormal Lactic. dt

## 2013-06-25 NOTE — ED Notes (Signed)
Pt comes from home where he lives with his wife.  Pt denies pain at this time.  Pt has diminished lung sounds bilaterally.  Pt's daughters state pt has a lung mass, but does not know which side. Pt's abdomen is distended, but pt does not express pain with palpation.  Pt has bowel sounds.  Pt has pulses in all 4 extremities, but his feet and legs are edematous bilaterally with pitting edema.  Pt has cough productive of thick sputum.  Daughter describes sputum as "clear."  Pt has not been eating or drinking regularly.  Pt has had several episodes of vomiting yesterday and today.  Pt has had diarrhea with dark tarry stool today.  Pt's O2 sats fell to 91% on RA.  Pt was placed on 2L O2 [place].

## 2013-06-25 NOTE — ED Notes (Signed)
Report given to [facility], ETA 5 minutes

## 2013-06-25 NOTE — ED Notes (Signed)
Bed: WA04 Expected date:  Expected time:  Means of arrival:  Comments: EMS 

## 2013-06-25 NOTE — ED Notes (Signed)
Report given to Joseph Art, Therapist, sports at [facility]. [facility] called for transportation.

## 2013-06-25 NOTE — H&P (Addendum)
Hospitalist Admission History and Physical  Patient name: Jason [name] Medical record number: [id number] Date of birth: 1935-03-07 Age: 79 y.o. Gender: male  Primary Care Provider: Provider Not In System  Chief Complaint: weakness, metastatic cholangiocarcinoma, GIB  Oncology: Julien Nordmann  GI: [facility]  History of Present Illness:This is a 79 y.o. year old male with prior hx/o HTN, HLD, CAD s/p stents x2 and noted recent diagnosis of metatstatic cholangiocarcinoma by hospital admission in setting of weight loss and markedly elevated LFTs 05/2013. Please see discharge summary for full details. Pt was started on chemotherapy earlier this week. Per the family, pt has had general poor appetite over the last 3 months, but since chemotherapy, pt has had no appetite at well, with very little, if any po intake. Family states that pt has been overall very lethargic since chemo. No fevers, chills, diarrhea, nausea, vomiting.  Family states that earlier today, pt had a large melenotic BM at home. Per the family, this has never happened before. Pt was brought the ER for further evaluation subsequently.  On presentation, pt was hemoccult positive.  Pt also had multiple metabolic derangements including Na @ 127, K @ 5.6. No peaked T waves on EKG. Cr. 1.64, BUN 81, AST/ALT @ 236/117, ALP 602, t bili 23. Noted hgb drop from 15.2-->11.1 since 1/14. Pt typed and screened.     Patient Active Problem List   Diagnosis Date Noted  . GI bleed 06/08/2013  . GIB (gastrointestinal bleeding) 06/26/2013  . Cholangiocarcinoma 06/15/2013  . Elevated alkaline phosphatase level 06/01/2013  . Elevated liver enzymes 06/01/2013  . Bile duct obstruction 06/01/2013  . Severe protein-calorie malnutrition 06/01/2013  . Cancer of unknown origin 06/01/2013  . Hyperbilirubinemia 05/31/2013  . Weight loss 05/12/2013  . Tobacco use 05/12/2013  . CAD (coronary artery disease)   . Hypertension   . Hyperlipidemia    Past Medical  History: Past Medical History  Diagnosis Date  . CAD (coronary artery disease)     , acute IMI  . Hypertension   . Hyperlipidemia   . Cholangiocarcinoma     Past Surgical History: Past Surgical History  Procedure Laterality Date  . Heart stint    . Cataracts      Social History: History   Social History  . Marital Status: Married    Spouse Name: N/A    Number of Children: N/A  . Years of Education: N/A   Social History Main Topics  . Smoking status: Current Some Day Smoker -- 0.50 packs/day for 60 years    Types: Cigarettes  . Smokeless tobacco: Never Used  . Alcohol Use: No  . Drug Use: No  . Sexual Activity: None   Other Topics Concern  . None   Social History Narrative   Lives with wife and other fam member at home   Good functional capacity    Family History: Family History  Problem Relation Age of Onset  . Stomach cancer Brother     Allergies: No Known Allergies  Current Facility-Administered Medications  Medication Dose Route Frequency Provider Last Rate Last Dose  . 0.9 %  sodium chloride infusion   Intravenous Continuous Shanda Howells, MD      . sodium chloride 0.9 % injection 3 mL  3 mL Intravenous Q12H Shanda Howells, MD      . sodium polystyrene (KAYEXALATE) 15 GM/60ML suspension 45 g  45 g Oral Once Shanda Howells, MD       Current Outpatient Prescriptions  Medication Sig Dispense  Refill  . aspirin 325 MG tablet Take 325 mg by mouth daily.      Marland Kitchen latanoprost (XALATAN) 0.005 % ophthalmic solution Place 1 drop into both eyes at bedtime.       . prochlorperazine (COMPAZINE) 10 MG tablet Take 1 tablet (10 mg total) by mouth every 6 (six) hours as needed for nausea or vomiting.  30 tablet  1  . ramipril (ALTACE) 5 MG capsule Take 5 mg by mouth daily.      . sildenafil (VIAGRA) 50 MG tablet Take 50 mg by mouth daily as needed for erectile dysfunction.      . timolol (TIMOPTIC) 0.5 % ophthalmic solution Place 1 drop into both eyes daily.       .  nitroGLYCERIN (NITROSTAT) 0.4 MG SL tablet Place 0.4 mg under the tongue every 5 (five) minutes as needed for chest pain.       Review Of Systems: 12 point ROS negative except as noted above in HPI.  Physical Exam: Filed Vitals:   06/22/2013 1947  BP: 102/50  Pulse: 94  Temp:   Resp: 16    General: underweight, cachectic  HEENT: PERRLA and extra ocular movement intact, + mild scleral icterus,  Noted coffee ground sputum on oral exam Heart: S1, S2 normal, no murmur, rub or gallop, regular rate and rhythm Lungs: clear to auscultation Abdomen: + abd distension, + bowel sounds, non tender abdomen.  Extremities: 2+ edema bilaterally  Skin:no rashes Neurology: normal without focal findings, mental status, speech normal, alert and oriented x3, PERLA and reflexes normal and symmetric  Labs and Imaging: Lab Results  Component Value Date/Time   NA 127* 06/07/2013  3:55 PM   NA 128* 06/22/2013  8:49 AM   K 5.6* 06/16/2013  3:55 PM   K 4.3 06/22/2013  8:49 AM   CL 90* 06/26/2013  3:55 PM   CO2 22 06/12/2013  3:55 PM   CO2 26 06/22/2013  8:49 AM   BUN 81* 06/16/2013  3:55 PM   BUN 18.8 06/22/2013  8:49 AM   CREATININE 1.64* 06/19/2013  3:55 PM   CREATININE 1.1 06/22/2013  8:49 AM   GLUCOSE 132* 06/24/2013  3:55 PM   GLUCOSE 121 06/22/2013  8:49 AM   Lab Results  Component Value Date   WBC 6.8 06/04/2013   HGB 11.1* 06/21/2013   HCT 31.0* 06/26/2013   MCV 89.6 06/06/2013   PLT 214 06/17/2013    Dg Chest 2 View  06/20/2013   CLINICAL DATA:  Productive cough. Undergoing chemotherapy for liver cancer. Rectal bleeding.  EXAM: CHEST  2 VIEW  COMPARISON:  05/12/2013  FINDINGS: The heart size and mediastinal contours are within normal limits. Low lung volumes are noted on today's study, however both lungs are clear. The visualized skeletal structures are unremarkable.  IMPRESSION: Low lung volumes.  No active disease.   Electronically Signed   By: Earle Gell M.D.   On: 06/09/2013 15:18     Assessment  and Plan: Jason [name] is a 79 y.o. year old male presenting with weakness, GIB, multiple metabolic derangements   GI: GIB- Likely upper GI source. Noted coffee ground sputum on oral exam. Suspect multifactorial etiology in setting of active GI cancer, smoking and recent chemo tx. Serial CBCs. GI consulted by EDP. NPO. Type and screen. Continue to follow closely. Continue to follow LFTs, t bili and ALP in setting of cholangiocarcinoma. Labs overall stable in comparison to previous. High dose PPI. Stepdown bed.  CV: BP LLN. Hold oral meds. Gentle hydration. Noted LE edema. Suspect likely secondary to hepatic disease. No active chest complaints currently   Renal: noted ARF. Likely prerenal in setting of decreased PO intake. Gentle hydration and reassess. There may be an element of inaccuracy in Cr given marked hyperbilrubinemia. Will follow. Noted elevated K. Note abnormalities on EKG. Kayexalate x 1.   Neuro: generalized weakness likely multifactorial in setting of active GI ca, GIB, chemotx, and dehydration. Check ammonia level. Gentle hydration. Serial CBCs. Continue to follow closely.   H-O: ABLA. hgb 15.2-->11.1 over 10 days. plts WNL. INR upper limits of normal. Serial CBCs. Follow closely.    Prophylaxis: SCDs.  Disposition: pending further evaluation.  Code Status:full code.        Shanda Howells MD  Pager: [phone]

## 2013-06-25 NOTE — ED Notes (Signed)
Per EMS - pt comes from home where he lives with family.  Pt's daughters called EMS because pt has had N/V/D and lethargy today.  Pt recently started chemotherapy to treat colon cancer.  Pt was hypotensive on scene at 90/60.  Pt received 400 ml NS bolus and pressure came up to 114/76.  Pt has not taken HTN meds since Wednesday.  Family reports dark tarry stools.  Pt denies pain and states he feels "great."  Pt has IV in place - 18 gauge LAC.

## 2013-06-25 NOTE — ED Provider Notes (Signed)
CSN: [id number]     Arrival date & time 06/13/2013  1406 History   First MD Initiated Contact with Patient 06/14/2013 1455     Chief Complaint  Patient presents with  . Rectal Bleeding   (Consider location/radiation/quality/duration/timing/severity/associated sxs/prior Treatment) Patient is a 79 y.o. male presenting with hematochezia and altered mental status. The history is provided by the patient, the spouse and a relative. The history is limited by the condition of the patient. No language interpreter was used.  Rectal Bleeding Quality:  Black and tarry Amount:  Unable to specify Duration:  1 day Timing:  Unable to specify Progression:  Unchanged Chronicity:  New Context: spontaneously   Similar prior episodes: no   Relieved by:  Nothing Worsened by:  Nothing tried Ineffective treatments:  None tried Associated symptoms: vomiting   Associated symptoms: no abdominal pain, no dizziness, no epistaxis, no fever, no hematemesis and no loss of consciousness   Vomiting:    Quality:  Stomach contents   Number of occurrences:  2   Severity:  Mild   Duration:  1 day   Timing:  Rare   Progression:  Unchanged Altered Mental Status Presenting symptoms: confusion and lethargy   Severity:  Severe Most recent episode:  Yesterday Episode history:  Single Duration:  1 day Timing:  Constant Progression:  Worsening Chronicity:  New Context: not taking medications as prescribed   Context comment:  No meds for 3 days.  Started first round of chemo for cholangiocarcinoma 3 days ago Associated symptoms: decreased appetite, vomiting and weakness   Associated symptoms: no abdominal pain, no agitation, no fever, no headaches, no nausea and no rash   Weakness:    Severity:  Severe   Duration:  3 days   Timing:  Constant   Progression:  Worsening   Chronicity:  New   Past Medical History  Diagnosis Date  . CAD (coronary artery disease)     , acute IMI  . Hypertension   . Hyperlipidemia   .  Cholangiocarcinoma    Past Surgical History  Procedure Laterality Date  . Heart stint    . Cataracts     Family History  Problem Relation Age of Onset  . Stomach cancer Brother    History  Substance Use Topics  . Smoking status: Current Some Day Smoker -- 0.50 packs/day for 60 years    Types: Cigarettes  . Smokeless tobacco: Never Used  . Alcohol Use: No    Review of Systems  Constitutional: Positive for activity change, appetite change, fatigue and decreased appetite. Negative for fever.  HENT: Negative for congestion, facial swelling, nosebleeds, rhinorrhea and trouble swallowing.   Eyes: Negative for photophobia and pain.  Respiratory: Negative for cough, chest tightness and shortness of breath.   Cardiovascular: Negative for chest pain and leg swelling.  Gastrointestinal: Positive for vomiting, diarrhea and hematochezia. Negative for nausea, abdominal pain, constipation and hematemesis.  Endocrine: Negative for polydipsia and polyuria.  Genitourinary: Negative for dysuria, urgency, decreased urine volume and difficulty urinating.  Musculoskeletal: Negative for back pain and gait problem.  Skin: Positive for color change. Negative for rash and wound.  Allergic/Immunologic: Negative for immunocompromised state.  Neurological: Positive for weakness. Negative for dizziness, loss of consciousness, facial asymmetry, speech difficulty, numbness and headaches.  Psychiatric/Behavioral: Positive for confusion. Negative for decreased concentration and agitation.    Allergies  Review of patient's allergies indicates no known allergies.  Home Medications   Current Outpatient Rx  Name  Route  Sig  Dispense  Refill  . aspirin 325 MG tablet   Oral   Take 325 mg by mouth daily.         Marland Kitchen latanoprost (XALATAN) 0.005 % ophthalmic solution   Both Eyes   Place 1 drop into both eyes at bedtime.          . prochlorperazine (COMPAZINE) 10 MG tablet   Oral   Take 1 tablet (10 mg  total) by mouth every 6 (six) hours as needed for nausea or vomiting.   30 tablet   1   . ramipril (ALTACE) 5 MG capsule   Oral   Take 5 mg by mouth daily.         . sildenafil (VIAGRA) 50 MG tablet   Oral   Take 50 mg by mouth daily as needed for erectile dysfunction.         . timolol (TIMOPTIC) 0.5 % ophthalmic solution   Both Eyes   Place 1 drop into both eyes daily.          . nitroGLYCERIN (NITROSTAT) 0.4 MG SL tablet   Sublingual   Place 0.4 mg under the tongue every 5 (five) minutes as needed for chest pain.          BP 102/50  Pulse 94  Temp(Src) 97.4 F (36.3 C) (Oral)  Resp 16  SpO2 95% Physical Exam  Constitutional: He is oriented to person, place, and time. He appears listless. He appears ill. No distress.  HENT:  Head: Normocephalic and atraumatic.  Mouth/Throat: No oropharyngeal exudate.  Eyes: Pupils are equal, round, and reactive to light.  Neck: Normal range of motion. Neck supple.  Cardiovascular: Normal rate, regular rhythm and normal heart sounds.  Exam reveals no gallop and no friction rub.   No murmur heard. Pulmonary/Chest: Effort normal and breath sounds normal. No respiratory distress. He has no wheezes. He has no rales.  Abdominal: Soft. Bowel sounds are normal. He exhibits distension. He exhibits no mass. There is no tenderness. There is no rigidity, no rebound and no guarding.  Musculoskeletal: Normal range of motion. He exhibits edema (2+ BLLE ). He exhibits no tenderness.  Neurological: He is oriented to person, place, and time. He appears listless. GCS verbal subscore is 4. GCS motor subscore is 6.  Skin: Skin is warm and dry.  jaundice  Psychiatric: He has a normal mood and affect.    ED Course  Procedures (including critical care time) Labs Review Labs Reviewed  CBC - Abnormal; Notable for the following:    RBC 3.46 (*)    Hemoglobin 11.1 (*)    HCT 31.0 (*)    RDW 21.8 (*)    All other components within normal limits   COMPREHENSIVE METABOLIC PANEL - Abnormal; Notable for the following:    Sodium 127 (*)    Potassium 5.6 (*)    Chloride 90 (*)    Glucose, Bld 132 (*)    BUN 81 (*)    Creatinine, Ser 1.64 (*)    Calcium 8.1 (*)    Total Protein 5.6 (*)    Albumin 1.7 (*)    AST 236 (*)    ALT 117 (*)    Alkaline Phosphatase 602 (*)    Total Bilirubin 23.0 (*)    GFR calc non Af Amer 39 (*)    GFR calc Af Amer 45 (*)    All other components within normal limits  URINALYSIS, ROUTINE W REFLEX MICROSCOPIC - Abnormal; Notable for the  following:    Color, Urine ORANGE (*)    APPearance CLOUDY (*)    Bilirubin Urine LARGE (*)    All other components within normal limits  PROTIME-INR - Abnormal; Notable for the following:    Prothrombin Time 15.9 (*)    All other components within normal limits  OCCULT BLOOD, POC DEVICE - Abnormal; Notable for the following:    Fecal Occult Bld POSITIVE (*)    All other components within normal limits  CG4 I-STAT (LACTIC ACID) - Abnormal; Notable for the following:    Lactic Acid, Venous 4.31 (*)    All other components within normal limits  AMMONIA  COMPREHENSIVE METABOLIC PANEL  CBC WITH DIFFERENTIAL  CBC  TYPE AND SCREEN  ABO/RH   Imaging Review Dg Chest 2 View  06/08/2013   CLINICAL DATA:  Productive cough. Undergoing chemotherapy for liver cancer. Rectal bleeding.  EXAM: CHEST  2 VIEW  COMPARISON:  05/12/2013  FINDINGS: The heart size and mediastinal contours are within normal limits. Low lung volumes are noted on today's study, however both lungs are clear. The visualized skeletal structures are unremarkable.  IMPRESSION: Low lung volumes.  No active disease.   Electronically Signed   By: Earle Gell M.D.   On: 07/01/2013 15:18    EKG Interpretation   None       MDM   1. GI bleed   2. Dehydration   3. Acute blood loss anemia   4. Cholangiocarcinoma   5. Elevated alkaline phosphatase level   6. Elevated liver enzymes   7. Hyperbilirubinemia     Pt is a 79 y.o. 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  On PT pt has no complaints, denies CP, abd pain, SOB, back pain, n/v, d/a, but is GCS 14, appears sleepy.  VSS.  Abdomen distended, but soft, non-tender.  Lungs w/ crackles at bases, dec breath sounds in L  upper lobe.  W/U shows drop in Hb from 15.2 on 1/21 to 11.1 today.  He has multiple CMP abnormalities including worsening BUN/Cr.  Heme stool +.  Urine not infected, CXR w/o acute findings.   8:08 PM Pt remains w/o complaint.  Pt will be admitted to [facility], GI consulted.          Neta Ehlers, MD 06/26/13 [phone]

## 2013-06-25 NOTE — ED Notes (Signed)
Blood draw attempted, RN unable to obtain enough blood. Phlebotomy called.

## 2013-06-25 NOTE — ED Notes (Signed)
Urine was collected by In/Out - Clicked wrong button [employer] account planner - NT)

## 2013-06-26 LAB — CBC
HEMATOCRIT: 29.4 % — AB (ref 39.0–52.0)
HEMATOCRIT: 27.2 % — AB (ref 39.0–52.0)
HEMOGLOBIN: 10.2 g/dL — AB (ref 13.0–17.0)
Hemoglobin: 11.1 g/dL — ABNORMAL LOW (ref 13.0–17.0)
MCH: 33.1 pg (ref 26.0–34.0)
MCH: 33.1 pg (ref 26.0–34.0)
MCHC: 37.8 g/dL — AB (ref 30.0–36.0)
MCHC: 37.5 g/dL — ABNORMAL HIGH (ref 30.0–36.0)
MCV: 87.8 fL (ref 78.0–100.0)
MCV: 88.3 fL (ref 78.0–100.0)
Platelets: 176 10*3/uL (ref 150–400)
Platelets: 169 10*3/uL (ref 150–400)
RBC: 3.35 MIL/uL — ABNORMAL LOW (ref 4.22–5.81)
RBC: 3.08 MIL/uL — ABNORMAL LOW (ref 4.22–5.81)
RDW: 21.8 % — ABNORMAL HIGH (ref 11.5–15.5)
RDW: 21.7 % — AB (ref 11.5–15.5)
WBC: 2 10*3/uL — ABNORMAL LOW (ref 4.0–10.5)
WBC: 1.3 10*3/uL — AB (ref 4.0–10.5)

## 2013-06-26 LAB — MRSA PCR SCREENING: MRSA by PCR: NEGATIVE

## 2013-06-26 MED ORDER — TIMOLOL MALEATE 0.5 % OP SOLN
1.0000 [drp] | Freq: Every day | OPHTHALMIC | Status: DC
Start: 2013-06-26 — End: 2013-06-28
  Administered 2013-06-26 – 2013-06-27 (×2): 1 [drp] via OPHTHALMIC
  Filled 2013-06-26: qty 5

## 2013-06-26 MED ORDER — PANTOPRAZOLE SODIUM 40 MG IV SOLR
40.0000 mg | Freq: Two times a day (BID) | INTRAVENOUS | Status: DC
  Administered 2013-06-26 – 2013-06-27 (×4): 40 mg via INTRAVENOUS
  Filled 2013-06-26 (×8): qty 40

## 2013-06-26 MED ORDER — PROCHLORPERAZINE MALEATE 10 MG PO TABS
10.0000 mg | ORAL_TABLET | Freq: Four times a day (QID) | ORAL | Status: DC | PRN
  Filled 2013-06-26: qty 1

## 2013-06-26 MED ORDER — ALBUMIN HUMAN 25 % IV SOLN
12.5000 g | Freq: Once | INTRAVENOUS | Status: AC
  Administered 2013-06-26: 12.5 g via INTRAVENOUS
  Filled 2013-06-26: qty 50

## 2013-06-26 MED ORDER — LATANOPROST 0.005 % OP SOLN
1.0000 [drp] | Freq: Every day | OPHTHALMIC | Status: DC
  Administered 2013-06-26 – 2013-06-27 (×2): 1 [drp] via OPHTHALMIC
  Filled 2013-06-26: qty 2.5

## 2013-06-26 MED ORDER — SODIUM CHLORIDE 0.9 % IV BOLUS (SEPSIS)
1000.0000 mL | Freq: Once | INTRAVENOUS | Status: AC
Start: 2013-06-26 — End: 2013-06-26
  Administered 2013-06-26: 1000 mL via INTRAVENOUS

## 2013-06-26 MED ORDER — SODIUM CHLORIDE 0.9 % IV BOLUS (SEPSIS)
1000.0000 mL | Freq: Once | INTRAVENOUS | Status: AC
  Administered 2013-06-26: 1000 mL via INTRAVENOUS

## 2013-06-26 NOTE — Progress Notes (Signed)
CRITICAL VALUE ALERT  Critical value received:  WBC=1.3  Date of notification:  06/26/13  Time of notification:    Critical value read back:yes  Nurse who received alert:  Lavella Lemons  MD notified (1st page):  Reidler  Time of first page:  2042  MD notified (2nd page): N/A  Time of second page: N/A  Responding MD:  Reidler  Time MD responded:  2045

## 2013-06-26 NOTE — Progress Notes (Signed)
[place] TEAM 1 - Stepdown/ICU TEAM Progress Note  Jason [name] IDP:[id number] DOB: 1935/03/15 DOA: 06/24/2013 PCP: Provider Not In System  Admit HPI / Brief Narrative: 79 y.o. M with prior h/o HTN, HLD, CAD s/p stents x2 and recent diagnosis of metatstatic cholangiocarcinoma by hospital admission in setting of weight loss and markedly elevated LFTs 05/2013. Pt was started on chemotherapy earlier the week of his admit. Per the family, pt had general poor appetite over the preceding 3 months, but following chemotherapy had very little, if any po intake. Pt had been very lethargic. No fevers, chills, diarrhea, nausea, vomiting.   On the day of admission the pt had a large melenotic BM at home. This had never happened before. Pt was brought the ER for further evaluation.  Pt was hemoccult positive.  Pt also had multiple metabolic derangements including Na @ 127, K @ 5.6. No peaked T waves on EKG. Cr. 1.64, BUN 81, AST/ALT @ 236/117, ALP 602, t bili 23. Noted hgb drop from 15.2-->11.1 since 1/14.   HPI/Subjective: Pt is somnolent, but responsive.  Family reports that he has been confused of late.  He does not provide a ROS.  Assessment/Plan:  GIB - Melena  Appears to have been self limited - wish to avoid procedure until more stable hemodynamically if possible - follow Hgb and bowel movement character  Acute blood loss anemia  Hgb is holding steady at the moment - cont to follow trend w/ Q12 hrs check - anticipate some drop w/ volume resuscitation   Metastatic cholangiocarcinoma w/ bile duct obstruction diagnosed in January of 2015 - followed by Dr. Julien Nordmann - chemotherapy with cisplatin and gemcitabine - seen by GI during recent admit who recommended against ERCP because most of the biliary obstruction was intrahepatic - Onc has informed pt he has an incurable condition and treatment is of a palliative nature - Tbil 21 during recent hospitalization - pt is followed by Dr. Silvano Rusk - family  understands gravity of his acute illness   Acute renal failure   Most c/w pre-renal azotemia - hydrate and follow   Hyponatremia likely due to circulating volume depletion - hydrate and follow   Hyperkalemia  Due to acute renal failure - hydrate and follow   Severe protein-calorie malnutrition  Due to CA - advance diet asap  CAD   Hx of Hypertension   Hyperlipidemia   Code Status: FULL Family Communication: spoke w/ wife and daughter at bedside at length  Disposition Plan: SDU  Consultants: none  Procedures: none  Antibiotics: none  DVT prophylaxis: SCDs  Objective: Blood pressure 89/48, pulse 90, temperature 97.1 F (36.2 C), temperature source Axillary, resp. rate 15, weight 87 kg (191 lb 12.8 oz), SpO2 94.00%.  Intake/Output Summary (Last 24 hours) at 06/26/13 0910 Last data filed at 06/26/13 0800  Gross per 24 hour  Intake   2775 ml  Output   1200 ml  Net   1575 ml   Exam: General: No acute respiratory distress - lethargic  Lungs: Clear to auscultation bilaterally without wheezes or crackles Cardiovascular: Regular rate and rhythm without murmur gallop or rub  Abdomen: Nontender, distended w/ probable ascite, bowel sounds positive, no rebound Extremities: No significant cyanosis, or clubbing;  3+ doughy edema bilateral lower extremities  Data Reviewed: Basic Metabolic Panel:  Recent Labs Lab 06/22/13 0849 06/15/2013 1555  NA 128* 127*  K 4.3 5.6*  CL  --  90*  CO2 26 22  GLUCOSE 121 132*  BUN 18.8 81*  CREATININE 1.1 1.64*  CALCIUM 8.8 8.1*   Liver Function Tests:  Recent Labs Lab 06/22/13 0849 06/14/2013 1555  AST 251* 236*  ALT 120* 117*  ALKPHOS 798* 602*  BILITOT 25.86* 23.0*  PROT 6.3* 5.6*  ALBUMIN 1.7* 1.7*   No results found for this basename: LIPASE, AMYLASE,  in the last 168 hours  Recent Labs Lab 07/01/2013 1555  AMMONIA RESULTS UNAVAILABLE DUE TO INTERFERING SUBSTANCE   CBC:  Recent Labs Lab 06/22/13 0849  06/19/2013 1555  WBC 8.0 6.8  NEUTROABS 5.9  --   HGB 15.2 11.1*  HCT 41.3 31.0*  MCV 88.8 89.6  PLT 225 214    Recent Results (from the past 240 hour(s))  TECHNOLOGIST REVIEW     Status: None   Collection Time    06/22/13  8:49 AM      Result Value Range Status   Technologist Review many target cells   Final  MRSA PCR SCREENING     Status: None   Collection Time    06/26/13  4:31 AM      Result Value Range Status   MRSA by PCR NEGATIVE  NEGATIVE Final   Comment:            The GeneXpert MRSA Assay (FDA     approved for NASAL specimens     only), is one component of a     comprehensive MRSA colonization     surveillance program. It is not     intended to diagnose MRSA     infection nor to guide or     monitor treatment for     MRSA infections.     Studies:  Recent x-ray studies have been reviewed in detail by the Attending Physician  Scheduled Meds:  Scheduled Meds: . sodium chloride   Intravenous STAT  . pantoprazole (PROTONIX) IV  40 mg Intravenous Q12H  . sodium chloride  3 mL Intravenous Q12H  . sodium polystyrene  45 g Oral Once    Time spent on care of this patient: 35 mins   [place]  [phone] Pager - Text Page per Shea Evans as per below:  On-Call/Text Page:      [email or web address]      password [email or web address]  If 7PM-7AM, please contact night-coverage [email or web address] Password [email or web address] 06/26/2013, 9:10 AM   LOS: 1 day

## 2013-06-26 NOTE — Progress Notes (Signed)
Pt transferred from [facility] ED, admitted to Rm/3S11. Pt comes from home with family. He is arousable with minimal verbal response to commands. Dry, flaky skin noted to BLE; Right shin area is pink but intact, possible old healing wound. Placed on telemetry, currently NSR. Oriented to room, instructed to call for assistance before getting out of bed, bed alarm on. Resting at this time, will continue to monitor. sBP running in the mid to low 80s, Walden Field, NP notified. Orders recieved

## 2013-06-26 NOTE — Progress Notes (Signed)
Patient transferred from [facility] with mental status change, weakness, history of metastatic cholangiocarcinoma  Filed Vitals:   06/12/2013 1541 06/18/2013 1947 06/17/2013 2326 06/26/13 0416  BP: 139/114 102/50 86/51 82/44   Pulse: 80 94 85 90  Temp:   97.7 F (36.5 C) 97.6 F (36.4 C)  TempSrc:   Axillary Oral  Resp: 16 16 20 13   Weight:   87 kg (191 lb 12.8 oz)   SpO2: 97% 95% 91% 94%    Physical examination: General : lethargic, but arousable. Lungs: clear bilaterally Heart: RRR  Abd + distention Extremities: + edema  Hypotensive on arrival 1 liter bolus of normal saline given with only mild improvement in BP. Received 2nd liter bolus around 6:20 am.

## 2013-06-27 LAB — DIFFERENTIAL
BASOS PCT: 1 % (ref 0–1)
Basophils Absolute: 0 10*3/uL (ref 0.0–0.1)
EOS PCT: 2 % (ref 0–5)
Eosinophils Absolute: 0 10*3/uL (ref 0.0–0.7)
LYMPHS ABS: 0.3 10*3/uL — AB (ref 0.7–4.0)
Lymphocytes Relative: 28 % (ref 12–46)
MONOS PCT: 9 % (ref 3–12)
Monocytes Absolute: 0.1 10*3/uL (ref 0.1–1.0)
NEUTROS PCT: 60 % (ref 43–77)
Neutro Abs: 0.6 10*3/uL — ABNORMAL LOW (ref 1.7–7.7)
WBC Morphology: INCREASED

## 2013-06-27 LAB — CBC
HCT: 24.4 % — ABNORMAL LOW (ref 39.0–52.0)
Hemoglobin: 9.1 g/dL — ABNORMAL LOW (ref 13.0–17.0)
MCH: 33 pg (ref 26.0–34.0)
MCHC: 37.3 g/dL — AB (ref 30.0–36.0)
MCV: 88.4 fL (ref 78.0–100.0)
Platelets: 160 10*3/uL (ref 150–400)
RBC: 2.76 MIL/uL — ABNORMAL LOW (ref 4.22–5.81)
RDW: 21.8 % — AB (ref 11.5–15.5)
WBC: 0.6 10*3/uL — CL (ref 4.0–10.5)

## 2013-06-27 LAB — COMPREHENSIVE METABOLIC PANEL
ALBUMIN: 1.5 g/dL — AB (ref 3.5–5.2)
ALT: 83 U/L — ABNORMAL HIGH (ref 0–53)
AST: 147 U/L — ABNORMAL HIGH (ref 0–37)
Alkaline Phosphatase: 393 U/L — ABNORMAL HIGH (ref 39–117)
BUN: 87 mg/dL — ABNORMAL HIGH (ref 6–23)
CO2: 22 mEq/L (ref 19–32)
CREATININE: 2.12 mg/dL — AB (ref 0.50–1.35)
Calcium: 8.1 mg/dL — ABNORMAL LOW (ref 8.4–10.5)
Chloride: 101 mEq/L (ref 96–112)
GFR, EST AFRICAN AMERICAN: 33 mL/min — AB (ref >90)
GFR, EST NON AFRICAN AMERICAN: 29 mL/min — AB (ref >90)
Glucose, Bld: 116 mg/dL — ABNORMAL HIGH (ref 70–99)
Potassium: 5.2 mEq/L (ref 3.7–5.3)
Sodium: 136 mEq/L — ABNORMAL LOW (ref 137–147)
Total Bilirubin: 18.3 mg/dL (ref 0.3–1.2)
Total Protein: 4.8 g/dL — ABNORMAL LOW (ref 6.0–8.3)

## 2013-06-27 LAB — APTT: aPTT: 36 seconds (ref 24–37)

## 2013-06-27 LAB — PROTIME-INR
INR: 1.43 (ref 0.00–1.49)
Prothrombin Time: 17.1 seconds — ABNORMAL HIGH (ref 11.6–15.2)

## 2013-06-27 MED ORDER — FILGRASTIM 480 MCG/1.6ML IJ SOLN
480.0000 ug | Freq: Every day | INTRAMUSCULAR | Status: DC
  Administered 2013-06-27: 480 ug via SUBCUTANEOUS
  Filled 2013-06-27 (×2): qty 1.6

## 2013-06-27 MED ORDER — BOOST / RESOURCE BREEZE PO LIQD: 1.0000 | Freq: Two times a day (BID) | ORAL | Status: DC

## 2013-06-27 NOTE — Progress Notes (Signed)
CRITICAL VALUE ALERT  Critical value received:  WBC=0.6 and Tota Bili=18.3  Date of notification:  06/27/13  Time of notification:  2229  Critical value read back:yes  Nurse who received alert:  Alto Gandolfo  MD notified (1st page):  Reidler  Time of first page:  0510  MD notified (2nd page):N/A  Time of second page:N/A  Responding MD:  Reidler  Time MD responded:  [phone]

## 2013-06-27 NOTE — Plan of Care (Signed)
Note am WBC has decreased further to 0.6 so will add differential to previously drawn blood re: abs neutrophil count- may need to add formal neutropenic precautions.  Erin Hearing ANP

## 2013-06-27 NOTE — Progress Notes (Signed)
Thank you for consulting the Palliative Medicine Team at [facility] to meet your patient's and family's needs.   The reason that you asked [place] to see your patient is for [place].  We have scheduled your patient for a meeting: 1/27 11am  The Surrogate decision make is: Mrs. Rawson Minix Contact information: [phone]  Other family members that need to be present: daughters - Mrs. Person to contact   Your patient is able/unable to participate: unlikely   Vinie Sill, NP Palliative Medicine Team Pager # [phone] Team Phone # [phone]

## 2013-06-27 NOTE — Progress Notes (Signed)
INITIAL NUTRITION ASSESSMENT  DOCUMENTATION CODES Per approved criteria  -Severe malnutrition in the context of chronic illness   INTERVENTION: Resource Breeze po BID, each supplement provides 250 kcal and 9 grams of protein. Monitor magnesium, potassium, and phosphorus daily for at least 3 days, MD to replete as needed, as pt is at risk for refeeding syndrome given severe malnutrition. If oral intake does not improve, recommend initiation of nutrition support if within [place]. RD to continue to follow nutrition care plan.  NUTRITION DIAGNOSIS: Inadequate oral intake related to poor appetite as evidenced by severe fat and muscle mass loss and poor oral intake.   Goal: Intake to meet >90% of estimated nutrition needs. Further diet advancement.  Monitor:  weight trends, lab trends, I/O's, PO intake, supplement tolerance  Reason for Assessment: Malnutrition Screening Tool  79 y.o. male  Admitting Dx: GIB  ASSESSMENT: PMHx significant for HTN, HLD, CAD s/p stents x 2, and recent dx of metastatic cholangiocarcinoma. Admitted with n/v/d and lethargy. Recently started chemo to treat colon cancer. Work-up reveals GIB.  Family reports that pt has not been eating or drinking regularly. Has had poor appetite x 3 months, but since pt has started chemotherapy, pt has had no appetite at all. No family at bedside.  Currently ordered for Clear Liquid diet order. Pt has not touched his breakfast tray. Pt would not awaken for my exam.  Nutrition Focused Physical Exam:  Subcutaneous Fat:  Orbital Region: moderate depletion Upper Arm Region: severe depletion Thoracic and Lumbar Region: n/a  Muscle:  Temple Region: severe depletion Clavicle Bone Region: severe depletion Clavicle and Acromion Bone Region: severe depletion Scapular Bone Region: n/a Dorsal Hand: n/a Patellar Region: n/a Anterior Thigh Region: n/an Posterior Calf Region: n/a  Edema: 3+ edema to BLE  Pt meets criteria for  severe MALNUTRITION in the context of chronic illness as evidenced by severe fat and muscle mass loss and intake of <75% x at least 1 month.  Pt is at risk for refeeding syndrome given severity of poor oral intake. Noted current potassium is WNL. No magnesium or phosphorus is available.  Height: Ht Readings from Last 1 Encounters:  06/26/13 5\' 8"  (1.727 m)    Weight: Wt Readings from Last 1 Encounters:  06/27/13 194 lb 0.1 oz (88 kg)    Ideal Body Weight: 154 lb  % Ideal Body Weight: 126%  Wt Readings from Last 10 Encounters:  06/27/13 194 lb 0.1 oz (88 kg)  06/15/13 183 lb 3.2 oz (83.099 kg)  05/31/13 168 lb 3.2 oz (76.295 kg)  05/12/13 167 lb 6.4 oz (75.932 kg)    Usual Body Weight: n/a  % Usual Body Weight: n/a  BMI:  Body mass index is 29.51 kg/(m^2). Overweight  Estimated Nutritional Needs: Kcal: 1850 - 2100 Protein: 115 - 130 g Fluid: 1.8 - 2 liters daily  Skin: intact  Diet Order: Clear Liquid  EDUCATION NEEDS: -No education needs identified at this time   Intake/Output Summary (Last 24 hours) at 06/27/13 1120 Last data filed at 06/27/13 0800  Gross per 24 hour  Intake 1182.5 ml  Output   1950 ml  Net -767.5 ml    Last BM: 1/25  Labs:   Recent Labs Lab 06/22/13 0849 06/24/2013 1555 06/27/13 0305  NA 128* 127* 136*  K 4.3 5.6* 5.2  CL  --  90* 101  CO2 26 22 22   BUN 18.8 81* 87*  CREATININE 1.1 1.64* 2.12*  CALCIUM 8.8 8.1* 8.1*  GLUCOSE  121 132* 116*    CBG (last 3)  No results found for this basename: GLUCAP,  in the last 72 hours  Scheduled Meds: . latanoprost  1 drop Both Eyes QHS  . pantoprazole (PROTONIX) IV  40 mg Intravenous Q12H  . timolol  1 drop Both Eyes Daily    Continuous Infusions: . sodium chloride 50 mL/hr at 06/26/13 1744    Past Medical History  Diagnosis Date  . CAD (coronary artery disease)     , acute IMI  . Hypertension   . Hyperlipidemia   . Cholangiocarcinoma     Past Surgical History   Procedure Laterality Date  . Heart stint    . Cataracts      Inda Coke MS, RD, LDN Pager: [phone] After-hours pager: [phone]

## 2013-06-27 NOTE — Progress Notes (Signed)
[place] TEAM 1 - Stepdown/ICU TEAM Progress Note  SHERIFF RODENBERG JJH:[id number] DOB: [birth year] DOA: 06/17/2013 PCP: Provider Not In System  Admit HPI / Brief Narrative: 79 y.o. M with prior h/o HTN, HLD, CAD s/p stents x2 and recent diagnosis of metatstatic cholangiocarcinoma by hospital admission in setting of weight loss and markedly elevated LFTs 05/2013. Pt was started on chemotherapy earlier the week of his admit. Per the family, pt had general poor appetite over the preceding 3 months, but following chemotherapy had very little, if any po intake. Pt had been very lethargic. No fevers, chills, diarrhea, nausea, vomiting.   On the day of admission the pt had a large melenotic BM at home. This had never happened before. Pt was brought the ER for further evaluation.  Pt was hemoccult positive.  Pt also had multiple metabolic derangements including Na @ 127, K @ 5.6. No peaked T waves on EKG. Cr. 1.64, BUN 81, AST/ALT @ 236/117, ALP 602, t bili 23. Noted hgb drop from 15.2-->11.1 since 1/14.   HPI/Subjective: Pt remains minimally interactive. No family is present at the time of my exam.    Assessment/Plan:  GIB - Melena  Appears to have been self limited - wish to avoid any procedures until more stable hemodynamically if possible - follow Hgb and bowel movement character  Progressive neutropenia WBC now down to 0.6 - spoke with Dr. Julien Nordmann - he will start Neupogen - prognosis seems quite poor - consult palliative care noting Dr. 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 Julien Nordmann - chemotherapy with cisplatin and gemcitabine - seen by GI during recent admit who recommended against ERCP because most of the biliary obstruction was intrahepatic - Onc has  informed pt he has an incurable condition and treatment is of a palliative nature - Tbil 21 during recent hospitalization - pt is followed by Dr. Silvano Rusk - family understands gravity of his acute illness -current TB subtle trend down  Acute renal failure   despite gentle hydration renal function not improving - follow   Hyponatremia likely due to circulating volume depletion - hydrate and follow   Hyperkalemia  Due to acute renal failure - hydrate and follow   Severe protein-calorie malnutrition  Due to CA - advance diet asap  CAD   Hx of Hypertension   Hyperlipidemia   Code Status: FULL Family Communication: Updated wife and daughter at bedside re: current status, planned oncologist eval and need for Neupogen- also discussed poor prognosis and need to explore [place]- agreeable to Palliative eval Disposition Plan: SDU  Consultants: Oncology Palliative Care   Procedures: none  Antibiotics: none  DVT prophylaxis: SCDs  Objective: Blood pressure 94/60, pulse 73, temperature 97.8 F (36.6 C), temperature source Oral, resp. rate 15, height 5\' 8"  (1.727 m), weight 194 lb 0.1 oz (88 kg), SpO2 100.00%.  Intake/Output Summary (Last 24 hours) at 06/27/13 1357 Last data filed at 06/27/13 0800  Gross per 24 hour  Intake 1182.5 ml  Output   1550 ml  Net -367.5 ml   Exam: General: Lethargic Lungs: Clear to auscultation bilaterally without wheezes or crackles Cardiovascular: Regular rate and rhythm without murmur gallop or rub  Abdomen: Nontender, distended w/ probable ascites, bowel sounds positive, no rebound  Extremities: No significant cyanosis, or clubbing;  3+ doughy edema bilateral lower extremities  Data Reviewed: Basic Metabolic Panel:  Recent Labs Lab 06/22/13 0849 06/24/2013 1555 06/27/13 0305  NA 128* 127* 136*  K 4.3 5.6* 5.2  CL  --  90* 101  CO2 26 22 22   GLUCOSE 121 132* 116*  BUN 18.8 81* 87*  CREATININE 1.1 1.64* 2.12*  CALCIUM 8.8 8.1* 8.1*    Liver Function Tests:  Recent Labs Lab 06/22/13 0849 06/15/2013 1555 06/27/13 0305  AST 251* 236* 147*  ALT 120* 117* 83*  ALKPHOS 798* 602* 393*  BILITOT 25.86* 23.0* 18.3*  PROT 6.3* 5.6* 4.8*  ALBUMIN 1.7* 1.7* 1.5*   No results found for this basename: LIPASE, AMYLASE,  in the last 168 hours  Recent Labs Lab 06/12/2013 1555  AMMONIA RESULTS UNAVAILABLE DUE TO INTERFERING SUBSTANCE   CBC:  Recent Labs Lab 06/22/13 0849 06/03/2013 1555 06/26/13 1240 06/26/13 1851 06/27/13 0305  WBC 8.0 6.8 2.0* 1.3* 0.6*  NEUTROABS 5.9  --   --   --  0.6*  HGB 15.2 11.1* 11.1* 10.2* 9.1*  HCT 41.3 31.0* 29.4* 27.2* 24.4*  MCV 88.8 89.6 87.8 88.3 88.4  PLT 225 214 176 169 160    Recent Results (from the past 240 hour(s))  TECHNOLOGIST REVIEW     Status: None   Collection Time    06/22/13  8:49 AM      Result Value Range Status   Technologist Review many target cells   Final  MRSA PCR SCREENING     Status: None   Collection Time    06/26/13  4:31 AM      Result Value Range Status   MRSA by PCR NEGATIVE  NEGATIVE Final   Comment:            The GeneXpert MRSA Assay (FDA     approved for NASAL specimens     only), is one component of a     comprehensive MRSA colonization     surveillance program. It is not     intended to diagnose MRSA     infection nor to guide or     monitor treatment for     MRSA infections.     Studies:  Recent x-ray studies have been reviewed in detail by the Attending Physician  Scheduled Meds:  Scheduled Meds: . feeding supplement (RESOURCE BREEZE)  1 Container Oral BID BM  . latanoprost  1 drop Both Eyes QHS  . pantoprazole (PROTONIX) IV  40 mg Intravenous Q12H  . timolol  1 drop Both Eyes Daily    Time spent on care of this patient: 35 mins   ELLIS,ALLISON L. ANP  [facility] Office  [phone] Pager - Text Page per Shea Evans as per below:  On-Call/Text Page:      [email or web address]      password [email or web address]  If 7PM-7AM, please contact  night-coverage [email or web address] Password [email or web address] 06/27/2013, 1:57 PM   LOS: 2 days   I have personally examined this patient and reviewed the entire database. I have reviewed the above note, made any necessary editorial changes, and agree with its content.  Cherene Altes, MD [facility]

## 2013-06-28 ENCOUNTER — Ambulatory Visit: Payer: Medicare Other | Admitting: Internal Medicine

## 2013-06-28 ENCOUNTER — Ambulatory Visit: Payer: Medicare Other

## 2013-06-28 ENCOUNTER — Other Ambulatory Visit: Payer: Medicare Other

## 2013-06-28 DIAGNOSIS — C221 Intrahepatic bile duct carcinoma: Principal | ICD-10-CM

## 2013-06-28 DIAGNOSIS — D62 Acute posthemorrhagic anemia: Secondary | ICD-10-CM

## 2013-06-28 LAB — BASIC METABOLIC PANEL
BUN: 100 mg/dL — AB (ref 6–23)
CHLORIDE: 101 meq/L (ref 96–112)
CO2: 21 mEq/L (ref 19–32)
CREATININE: 2.82 mg/dL — AB (ref 0.50–1.35)
Calcium: 8.2 mg/dL — ABNORMAL LOW (ref 8.4–10.5)
GFR, EST AFRICAN AMERICAN: 23 mL/min — AB (ref >90)
GFR, EST NON AFRICAN AMERICAN: 20 mL/min — AB (ref >90)
Glucose, Bld: 110 mg/dL — ABNORMAL HIGH (ref 70–99)
Potassium: 4.6 mEq/L (ref 3.7–5.3)
Sodium: 138 mEq/L (ref 137–147)

## 2013-06-28 LAB — CBC
HEMATOCRIT: 25 % — AB (ref 39.0–52.0)
Hemoglobin: 9.8 g/dL — ABNORMAL LOW (ref 13.0–17.0)
MCH: 33.1 pg (ref 26.0–34.0)
MCHC: 37.5 g/dL — ABNORMAL HIGH (ref 30.0–36.0)
MCV: 88.3 fL (ref 78.0–100.0)
PLATELETS: 146 10*3/uL — AB (ref 150–400)
RBC: 2.83 MIL/uL — ABNORMAL LOW (ref 4.22–5.81)
RDW: 21.4 % — AB (ref 11.5–15.5)
WBC: 1.3 10*3/uL — AB (ref 4.0–10.5)

## 2013-06-28 MED ORDER — MORPHINE SULFATE 4 MG/ML IJ SOLN
INTRAMUSCULAR | Status: AC
  Administered 2013-06-28: 4 mg
  Filled 2013-06-28: qty 1

## 2013-06-28 MED ORDER — MORPHINE SULFATE 2 MG/ML IJ SOLN: 2.0000 mg | INTRAMUSCULAR | Status: DC | PRN

## 2013-06-28 MED ORDER — IPRATROPIUM BROMIDE 0.02 % IN SOLN
0.5000 mg | Freq: Once | RESPIRATORY_TRACT | Status: AC
  Administered 2013-06-28: 0.5 mg via RESPIRATORY_TRACT
  Filled 2013-06-28: qty 2.5

## 2013-06-28 MED ORDER — ALBUTEROL SULFATE (2.5 MG/3ML) 0.083% IN NEBU
INHALATION_SOLUTION | RESPIRATORY_TRACT | Status: AC
  Filled 2013-06-28: qty 3

## 2013-06-28 MED ORDER — SODIUM CHLORIDE 0.9 % IV BOLUS (SEPSIS)
500.0000 mL | Freq: Once | INTRAVENOUS | Status: AC
  Administered 2013-06-28: 500 mL via INTRAVENOUS

## 2013-06-28 MED ORDER — ALBUTEROL SULFATE (2.5 MG/3ML) 0.083% IN NEBU
5.0000 mg | INHALATION_SOLUTION | Freq: Once | RESPIRATORY_TRACT | Status: AC
  Administered 2013-06-28: 5 mg via RESPIRATORY_TRACT
  Filled 2013-06-28: qty 6

## 2013-07-03 NOTE — Progress Notes (Signed)
Pt expired at 85, This RN and Eustace Moore RN assessed, MD/N, [place] [facility] referred, family at [facility] during expiration, compassion cart at [facility], family pastoral services at [facility], emotional support given, all questions answered, awaiting funeral home information

## 2013-07-03 NOTE — Progress Notes (Addendum)
[place] TEAM 1 - Stepdown/ICU TEAM Progress Note  Jason [name]:[id number] DOB: January 26, 1935 DOA: 06/16/2013 PCP: Provider Not In System  Admit HPI / Brief Narrative: 79 y.o. M with prior h/o HTN, HLD, CAD s/p stents x2 and recent diagnosis of metatstatic cholangiocarcinoma by hospital admission in setting of weight loss and markedly elevated LFTs 05/2013. Pt was started on chemotherapy earlier the week of his admit. Per the family, pt had general poor appetite over the preceding 3 months, but following chemotherapy had very little, if any po intake. Pt had been very lethargic. No fevers, chills, diarrhea, nausea, vomiting.   On the day of admission the pt had a large melenotic BM at home. This had never happened before. Pt was brought the ER for further evaluation.  Pt was hemoccult positive.  Pt also had multiple metabolic derangements including Na @ 127, K @ 5.6. No peaked T waves on EKG. Cr. 1.64, BUN 81, AST/ALT @ 236/117, ALP 602, t bili 23. Noted hgb drop from 15.2-->11.1 since 1/14.   HPI/Subjective: Pt remains minimally interactive. No family is present at the time of my exam.    Assessment/Plan: Metastatic cholangiocarcinoma w/ bile duct obstruction diagnosed in January of 2015 - followed by Dr. Julien Nordmann - chemotherapy with cisplatin and gemcitabine - seen by GI during recent admit who recommended against ERCP because most of the biliary obstruction was intrahepatic - Onc has informed pt he has an incurable condition and treatment was of a palliative nature - Tbil 21 during recent hospitalization - pt was followed by Dr. Silvano Rusk - family understands gravity of his acute illness  **arrived to room ~ 935 am and noted pt with increased WOB on 50% VM (new) and profoundly hypotensive with SBP 63- Dr. Wynelle Cleveland notified wife of pt rapidly declining status- wife agrees to DNR status- pt appears to be actively dying -begin prn morphine for resp sx's if needed after Palliative meets with  family today  GIB - Melena  Appears to have been self limited  Progressive neutropenia/new thrombocytopenia WBC down to 0.6 - spoke with Dr. Julien Nordmann who started Neupogen - prognosis seemed quite poor  1/26- consulted palliative care noting Dr. Julien Nordmann agreed reexploring this would be appropriate at this time-(see above)  Acute blood loss anemia  Hgb was slowly declining but now focus comfort  Acute renal failure   despite gentle hydration renal function did not improve and suspect profound hypoalbunemia caused third spacing/volume shifting  Hyponatremia likely due to circulating volume depletion   Hyperkalemia  Due to acute renal failure  Severe protein-calorie malnutrition  Due to CA - advance diet asap  CAD   Hx of Hypertension   Hyperlipidemia   Code Status: FULL Family Communication: Physician updated wife as above Disposition Plan: SDU-death appears imminent  Consultants: Oncology Palliative Care   Procedures: None  Antibiotics: none  DVT prophylaxis: SCDs  Objective: Blood pressure 63/34, pulse 89, temperature 97.5 F (36.4 C), temperature source Axillary, resp. rate 22, height 5\' 8"  (1.727 m), weight 194 lb 0.1 oz (88 kg), SpO2 92.00%.  Intake/Output Summary (Last 24 hours) at 2013/07/18 1101 Last data filed at 07-18-2013 0800  Gross per 24 hour  Intake 2180.83 ml  Output    975 ml  Net 1205.83 ml   Exam: General: Lethargic and basically unresponsive-moderate resp distress Lungs: Coarse bilateral rhonchi that are audible without stethoscope Cardiovascular: Regular rate and rhythm without murmur gallop or rub  Abdomen: Nontender, distended w/ probable ascites, bowel sounds  positive, no rebound Extremities: No significant cyanosis, or clubbing;  3+ doughy edema bilateral lower extremities  Data Reviewed: Basic Metabolic Panel:  Recent Labs Lab 06/22/13 0849 07/01/2013 1555 06/27/13 0305 07-08-13 0310  NA 128* 127* 136* 138  K 4.3 5.6* 5.2 4.6    CL  --  90* 101 101  CO2 26 22 22 21   GLUCOSE 121 132* 116* 110*  BUN 18.8 81* 87* 100*  CREATININE 1.1 1.64* 2.12* 2.82*  CALCIUM 8.8 8.1* 8.1* 8.2*   Liver Function Tests:  Recent Labs Lab 06/22/13 0849 06/08/2013 1555 06/27/13 0305  AST 251* 236* 147*  ALT 120* 117* 83*  ALKPHOS 798* 602* 393*  BILITOT 25.86* 23.0* 18.3*  PROT 6.3* 5.6* 4.8*  ALBUMIN 1.7* 1.7* 1.5*   No results found for this basename: LIPASE, AMYLASE,  in the last 168 hours  Recent Labs Lab 06/21/2013 1555  AMMONIA RESULTS UNAVAILABLE DUE TO INTERFERING SUBSTANCE   CBC:  Recent Labs Lab 06/22/13 0849 06/26/2013 1555 06/26/13 1240 06/26/13 1851 06/27/13 0305 07-08-13 0310  WBC 8.0 6.8 2.0* 1.3* 0.6* 1.3*  NEUTROABS 5.9  --   --   --  0.6*  --   HGB 15.2 11.1* 11.1* 10.2* 9.1* 9.8*  HCT 41.3 31.0* 29.4* 27.2* 24.4* 25.0*  MCV 88.8 89.6 87.8 88.3 88.4 88.3  PLT 225 214 176 169 160 146*    Recent Results (from the past 240 hour(s))  TECHNOLOGIST REVIEW     Status: None   Collection Time    06/22/13  8:49 AM      Result Value Range Status   Technologist Review many target cells   Final  MRSA PCR SCREENING     Status: None   Collection Time    06/26/13  4:31 AM      Result Value Range Status   MRSA by PCR NEGATIVE  NEGATIVE Final   Comment:            The GeneXpert MRSA Assay (FDA     approved for NASAL specimens     only), is one component of a     comprehensive MRSA colonization     surveillance program. It is not     intended to diagnose MRSA     infection nor to guide or     monitor treatment for     MRSA infections.     Studies:  Recent x-ray studies have been reviewed in detail by the Attending Physician  Scheduled Meds:  Scheduled Meds: . feeding supplement (RESOURCE BREEZE)  1 Container Oral BID BM  . filgrastim  480 mcg Subcutaneous Daily  . latanoprost  1 drop Both Eyes QHS  . pantoprazole (PROTONIX) IV  40 mg Intravenous Q12H  . timolol  1 drop Both Eyes Daily     Time spent on care of this patient: 35 mins   ELLIS,ALLISON L. ANP  [facility] Office  [phone] Pager - Text Page per Shea Evans as per below:  On-Call/Text Page:      [email or web address]      password [email or web address]  If 7PM-7AM, please contact night-coverage [email or web address] Password [email or web address] 08-Jul-2013, 11:01 AM   LOS: 3 days   I have examined the patient, reviewed the chart and modified the above note which I agree with.   Min Collymore,MD [phone] 07/08/13, 2:43 PM

## 2013-07-03 NOTE — Progress Notes (Signed)
Utilization review completed.  

## 2013-07-03 NOTE — Discharge Summary (Signed)
    Death Summary  Jason [name] XBL:[id number] DOB: 12-Mar-1935 DOA: 06/08/2013  PCP: Provider Not In System  Admit date: 06/05/2013 Date of Death: 07-02-13  Final Diagnoses:  Acute hypoxic respiratory failure Hypotension due to MSOF (in setting of metastatic cancer) Metastatic cholangiocarcinoma with intra hepatic ductal obstruction Chemotherapy related neutropenia/thrombocytopenia Acute renal failure Severe hypo albunemia Acute Upper GIB- resolved/self limited   History of present illness:  79 y.o. M with prior h/o HTN, HLD, CAD s/p stents x2 and recent diagnosis of metatstatic cholangiocarcinoma early January 2015. Pt was started on palliative chemotherapy. Per the family, pt had general poor appetite over the preceding 3 months, but following chemotherapy had very little, if any po intake. Pt had been very lethargic. No fevers, chills, diarrhea, nausea, vomiting.  On the day of admission the pt had a large melenotic BM at home. This had never happened before. Pt was brought the ER for further evaluation. Pt was hemoccult positive.  Pt also had multiple metabolic derangements including Na @ 127, K @ 5.6. No peaked T waves on EKG. Cr. 1.64, BUN 81, AST/ALT @ 236/117, ALP 602, t bili 23. Noted hgb drop from 15.2-->11.1 since 1/14.    Hospital Course:  Patient admitted with symptoms of GI bleeding as evidenced by large melanotic bowel movement at home. After arrival was found to have multiple metabolic derangements consistent with severe dehydration as well as hemoglobin had decreased from 15 to 11. Patient had no further episodes of GI bleeding after arrival. Hemoglobin remained stable and given patient's underlying comorbidity of metastatic cholangiocarcinoma additional workup was deferred.  Patient was diagnosed January 2015 with metastatic cholangiolacarcinoma with bile duct obstruction and was followed by Dr. Julien Nordmann as an outpatient. He had been started on palliative chemotherapy.  Dr. Carlean Purl gastroenterology had previously evaluated the patient and because the majority of the biliary obstruction was intrahepatic he was not a candidate for biliary stent. Prior to this admission the patient had previously been informed that he had an incurable condition and treatment was a palliative nature.  On 06/27/2013 patient's white count decreased to 600. Dr. Julien Nordmann was consulted and began Neupogen. In addition patient's poor prognosis was discussed and he agreed that palliative evaluation would be the best option. This was also discussed with the patient wife and family at the bedside and they agreed. Patient's mental status had already begun to decrease and he was noted to have subtle increased work of breathing on the afternoon of 06/27/2013.  On the morning of 07/02/2013 at 9:35 AM we'll run into the room to find a patient with increased work of breathing on a 50% Ventimask and he was profoundly hypotensive with a systolic blood pressure of 63. Patient noted to be a full code with palliative meeting planned for 11 AM today. Dr. Wynelle Cleveland promptly notified the patient's wife of his rapidly declining status and recommended she come to the hospital as soon as possible. His wife did decide on a DO NOT RESUSCITATE status. Shortly thereafter the patient's wife and other family members arrived to the bedside. Morphine was initiated for respiratory distress. The patient expired at 11 AM.  Death certificate completed and given to nursing staff.   Time: 1100 am  Signed:  ELLIS,ALLISON L. ANP  [facility] 07/02/2013, 11:22 AM    I have examined the patient, reviewed the chart and modified the above note which I agree with.   Rhilynn Preyer,MD [phone] 07-02-2013, 4:58 PM

## 2013-07-03 NOTE — Progress Notes (Signed)
Chart Note  Chart Reviewed for current admission. This is an unfortunate 79 Yo AAM who is recently diagnosed with metastatic Cholangiocarcinoma, started last week on first dose of treatment with Cisplatin and Gemcitabiine. He was admitted with GI bleed. He also has Neutropenia secondary to recent Chemotherapy. I started him on Neupogen yesterday with improvement in his Neutrophil count. The patient has very poor prognosis and life expectancy is very short. This has already been discussed with the patient and his family on previous office visits. I strongly recommend Palliative care and hospice.  I will try to stop by later today to see the patient and discuss his prognosis again with him and his family. Thank you for taking good care of Jason [name].

## 2013-07-03 DEATH — deceased

## 2013-07-05 ENCOUNTER — Other Ambulatory Visit: Payer: Medicare Other

## 2013-07-12 ENCOUNTER — Other Ambulatory Visit: Payer: Medicare Other

## 2013-07-19 ENCOUNTER — Other Ambulatory Visit: Payer: Medicare Other

## 2013-07-26 ENCOUNTER — Other Ambulatory Visit: Payer: Medicare Other

## 2014-01-25 ENCOUNTER — Other Ambulatory Visit: Payer: Self-pay | Admitting: Pharmacist

## 2015-11-28 IMAGING — CR DG CHEST 2V
2 series · 2 of 2 positions shown · non-contrast
Comparison: None.

CLINICAL DATA: Tobacco use and hypertension.

EXAM:
CHEST  2 VIEW

[w chest pa]
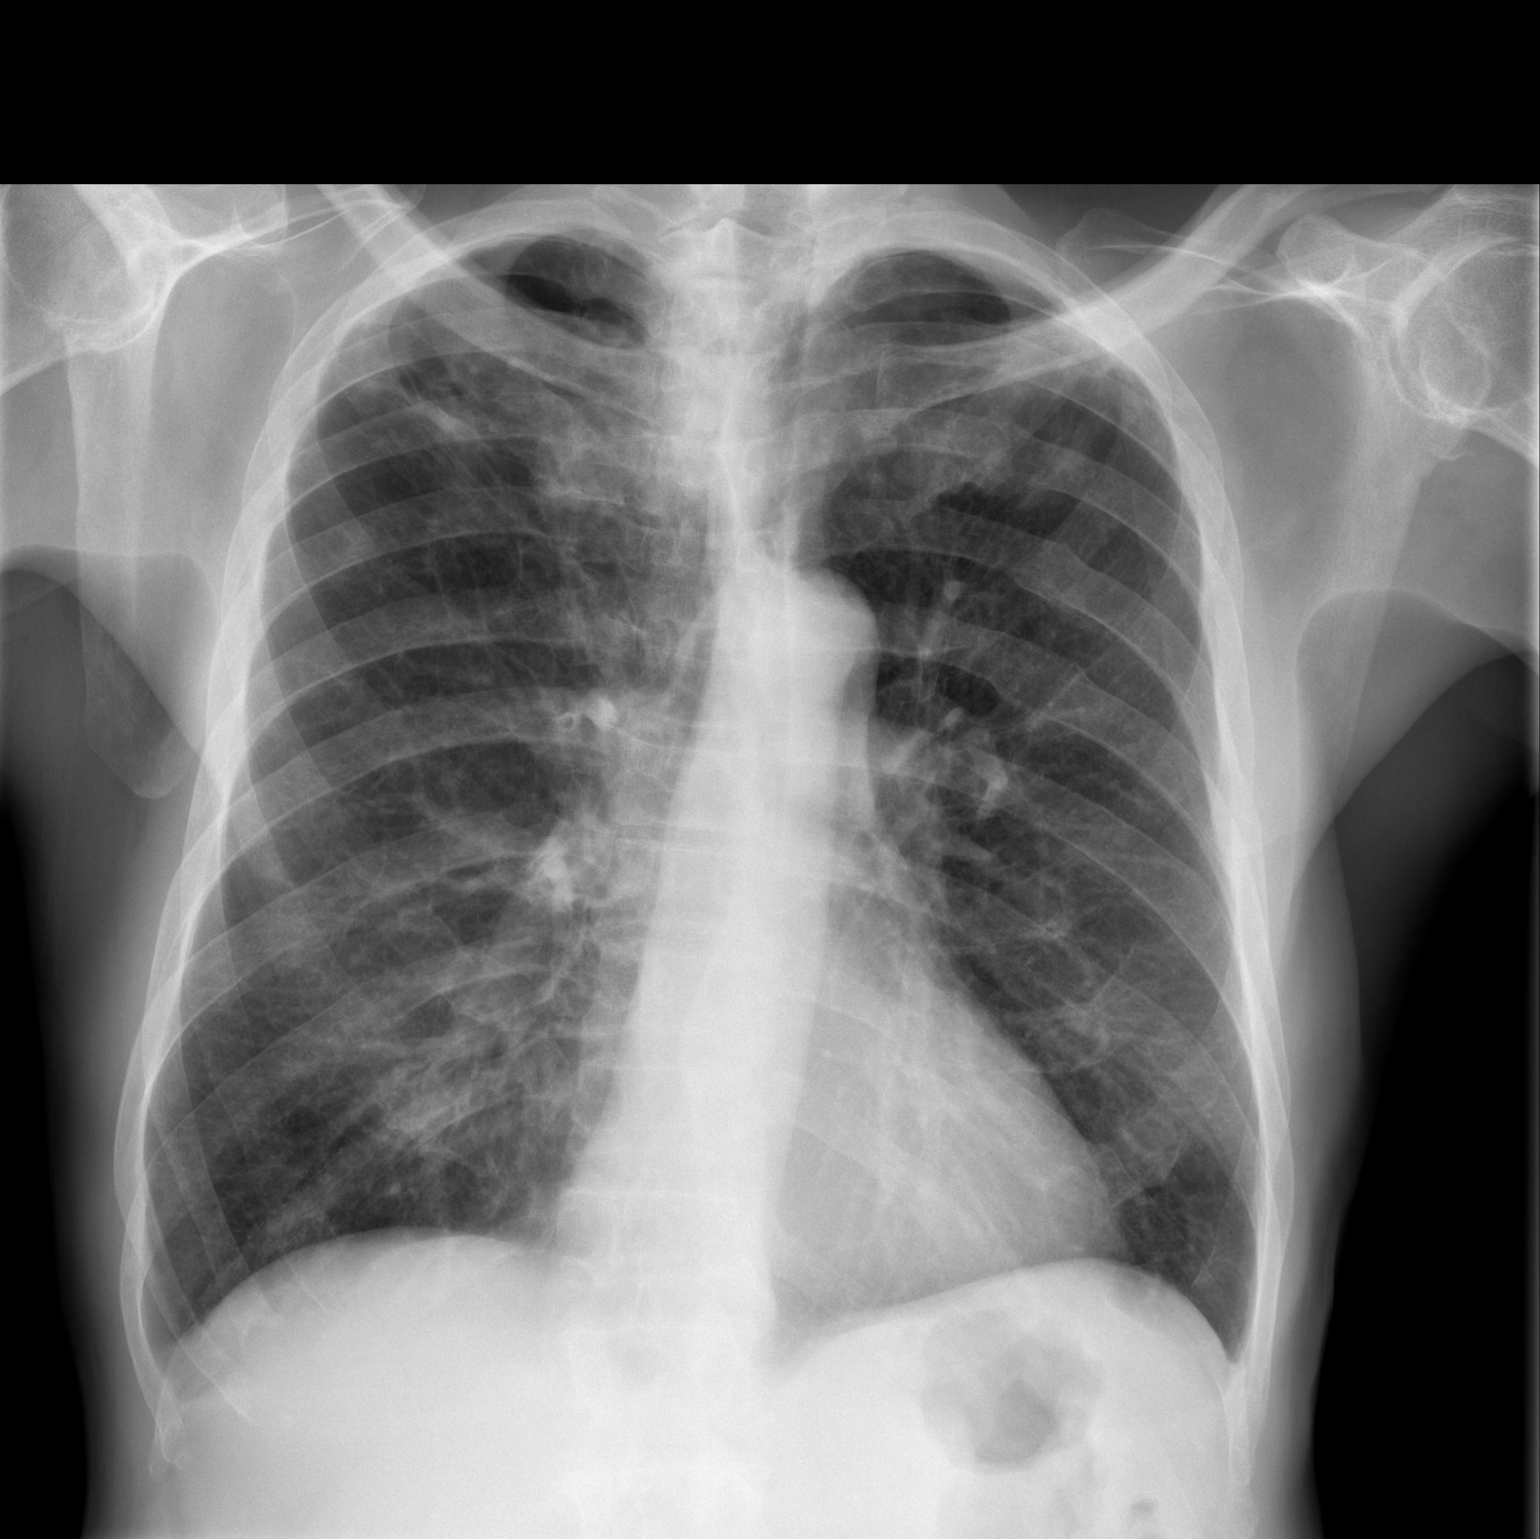

[w chest lat]
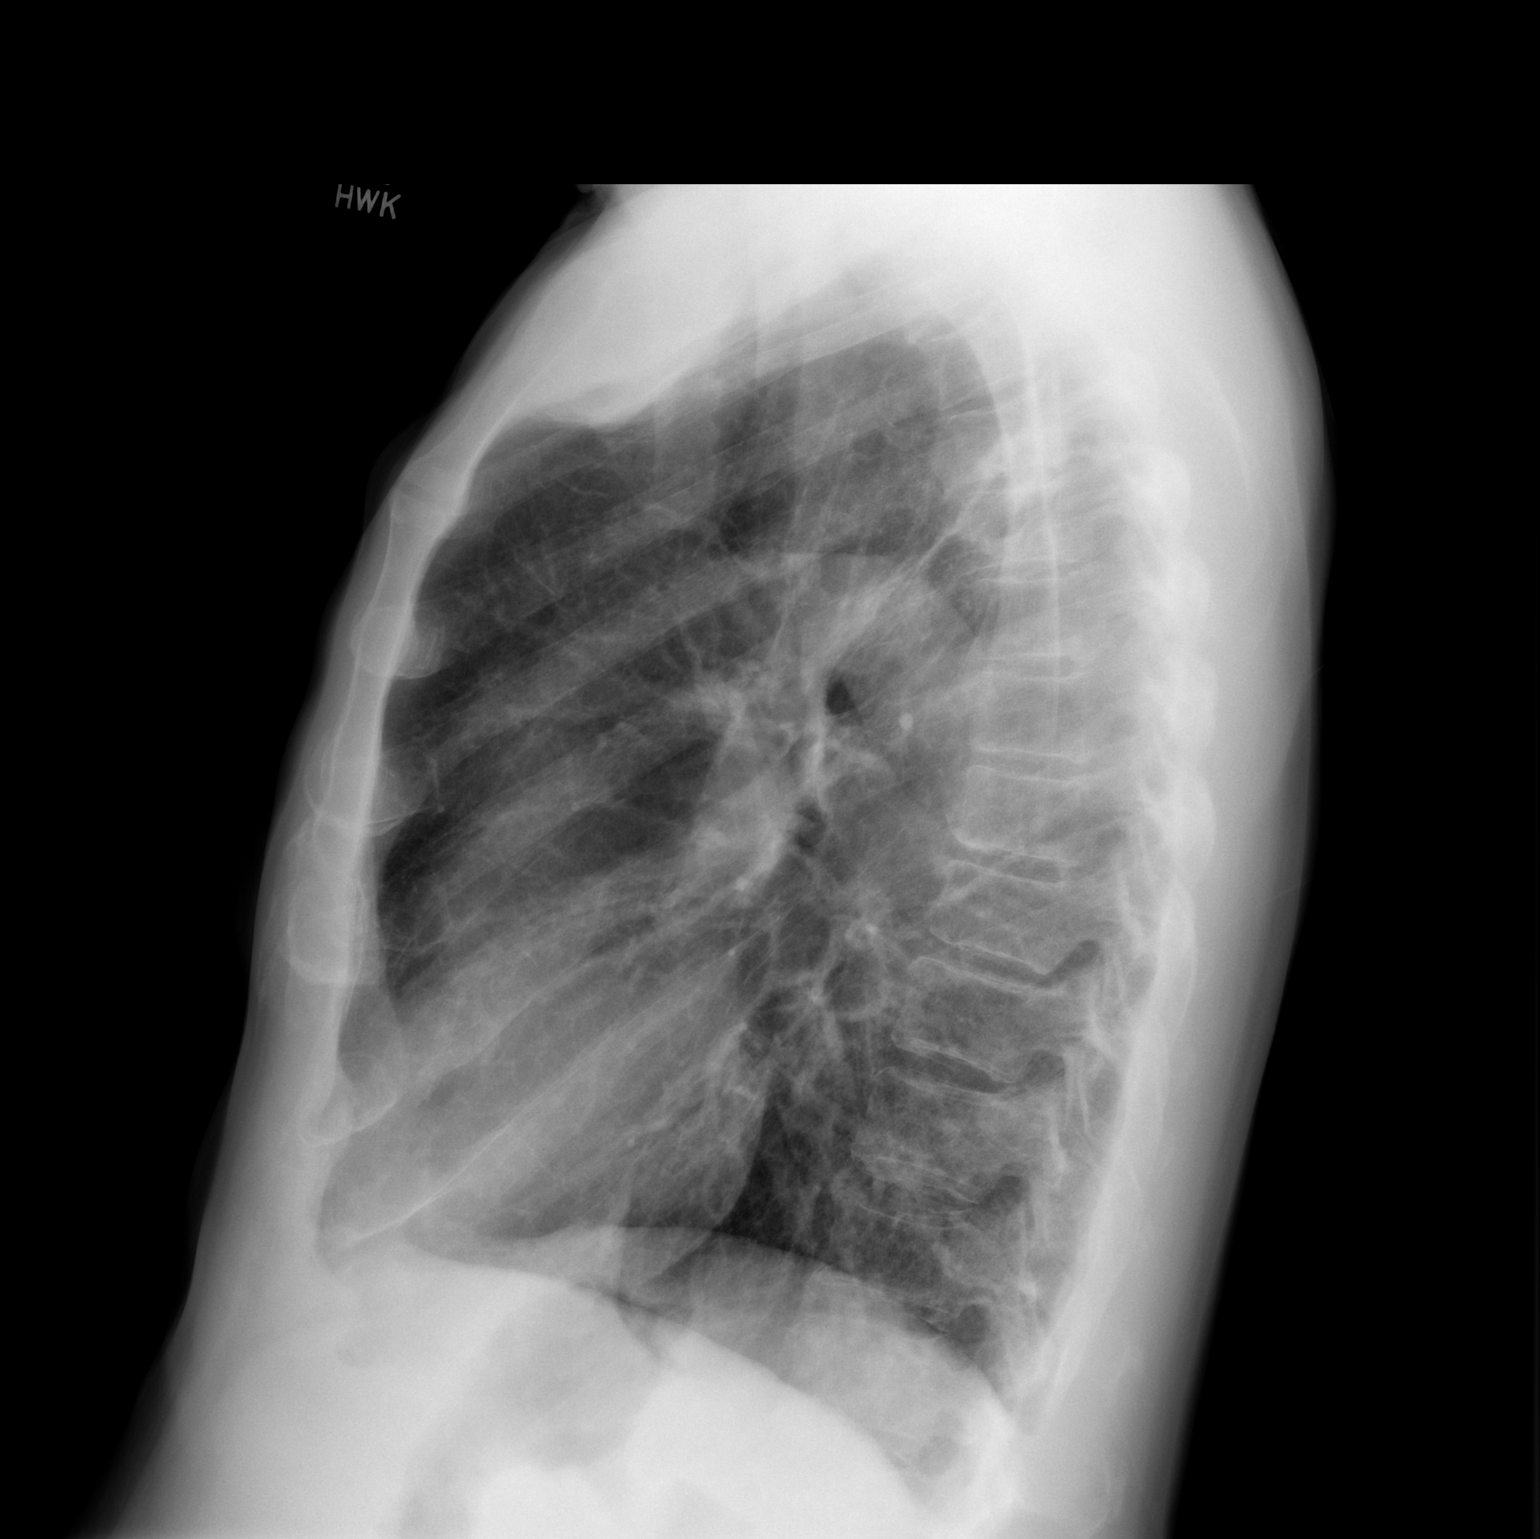

[2 of 2 positions shown; findings below may reference images not displayed]

FINDINGS: The cardiomediastinal silhouette is within normal limits. The lungs
are mildly hyperinflated. Interstitial markings are mildly prominent
diffusely. There is an approximately 1 cm elongated focal opacity in
the right upper lobe. Patch, somewhat ill-defined opacities are also
present in the left upper lobe and right lower lobe. There is no
evidence of pleural effusion or pneumothorax. Multiple old
left-sided rib fractures are present.
IMPRESSION: Hyperinflation with scattered, ill-defined patchy opacities in both
upper lobes and right lower lobe. While these could reflect the
sequelae of current or past infection, given patient's smoking
history underlying malignancy is not excluded. If available,
comparison with any prior chest radiographs would be helpful to
assess stability. Otherwise, consider chest CT for further
evaluation.

## 2015-12-19 IMAGING — US US PARACENTESIS
1 series · 6 of 6 positions shown · non-contrast
Comparison: none

CLINICAL DATA: Obstructive jaundice, ascites, request for
diagnostic paracentesis.

EXAM:
ULTRASOUND GUIDED PARACENTESIS
TECHNIQUE: Survey ultrasound of the abdomen was performed and an appropriate
skin entry site in the RLQ abdomen was selected. Skin site was
marked, prepped with Betadine, and draped in usual sterile fashion,
and infiltrated locally with 1% lidocaine. A 5 French multisidehole
Auntyjatty Delowr needle was advanced into the peritoneal space until
fluid could be aspirated. The sheath was advanced and the needle
removed. 900 ml of bright yellow ascites were aspirated. No
immediate complication. Fluid was sent for cytology.

[Series 1: us paracentesis · 0.27mm/px · 6 of 6 slices shown]
[im 1/6]
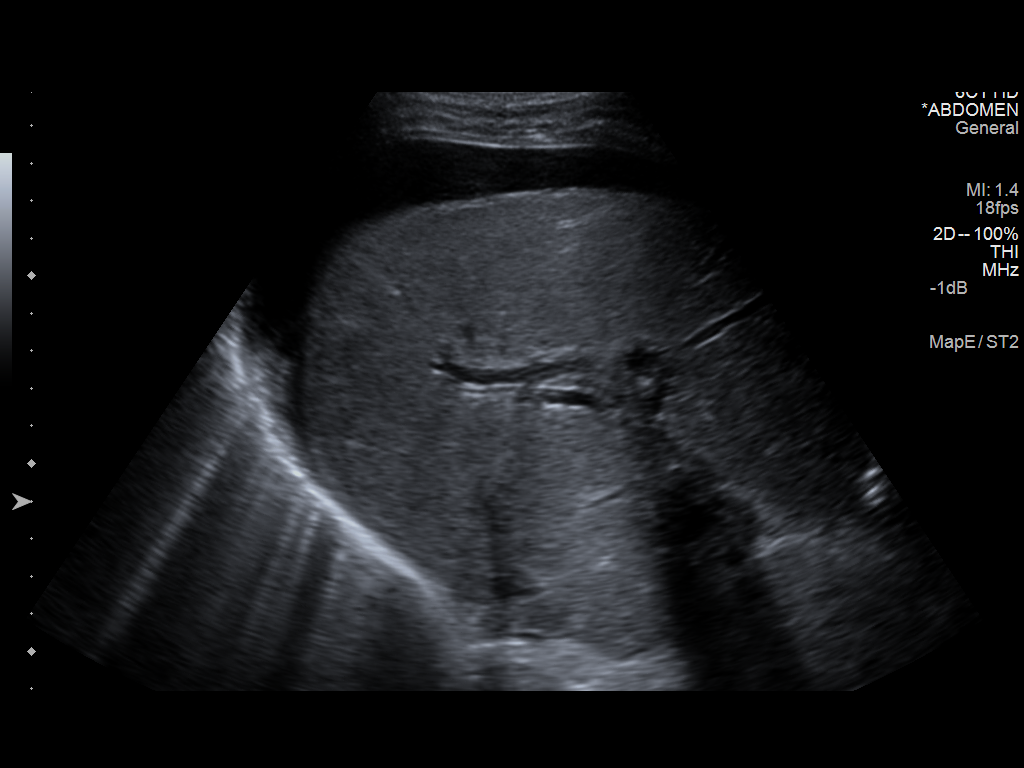
[im 2/6]
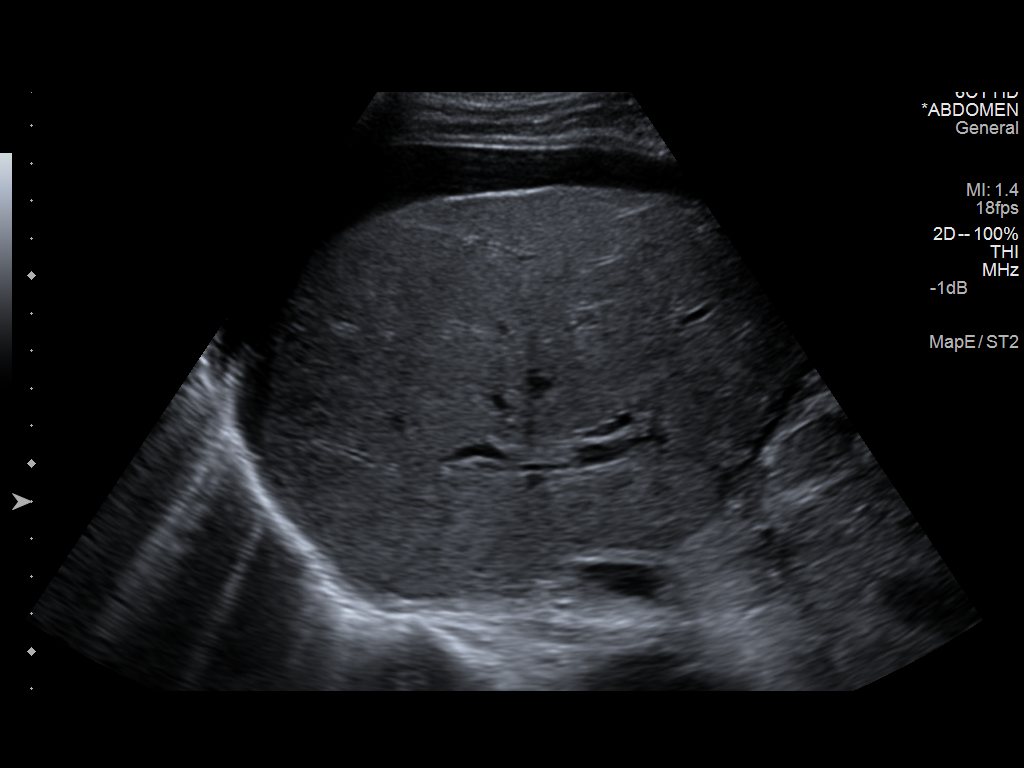
[im 3/6]
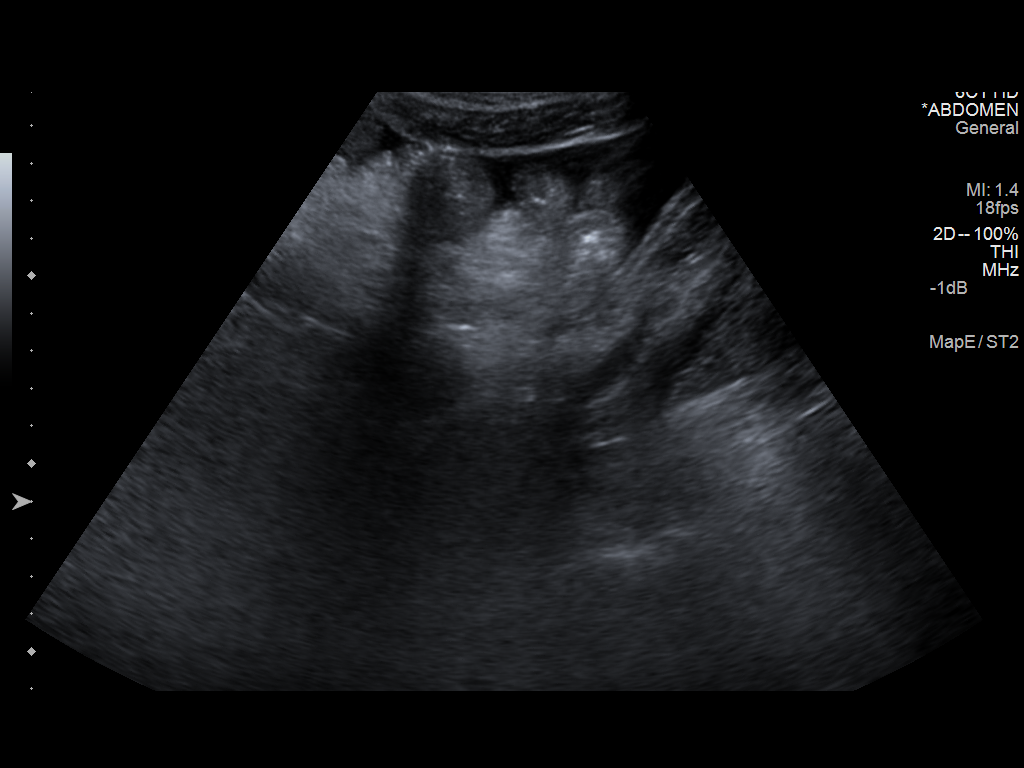
[im 4/6]
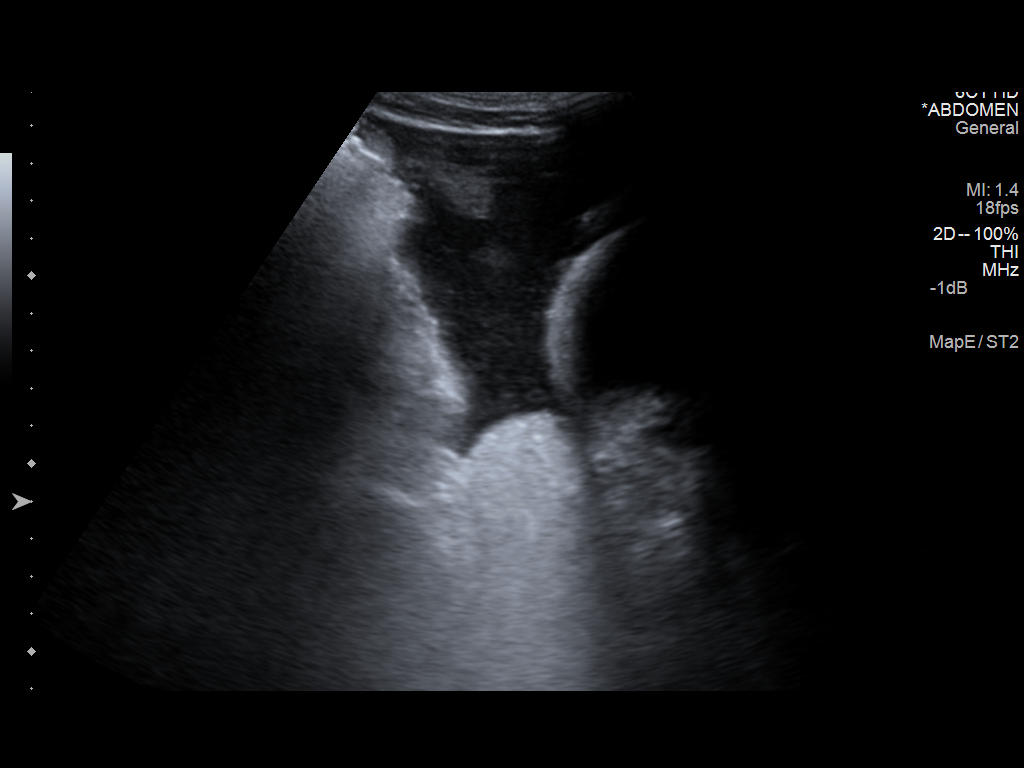
[im 5/6]
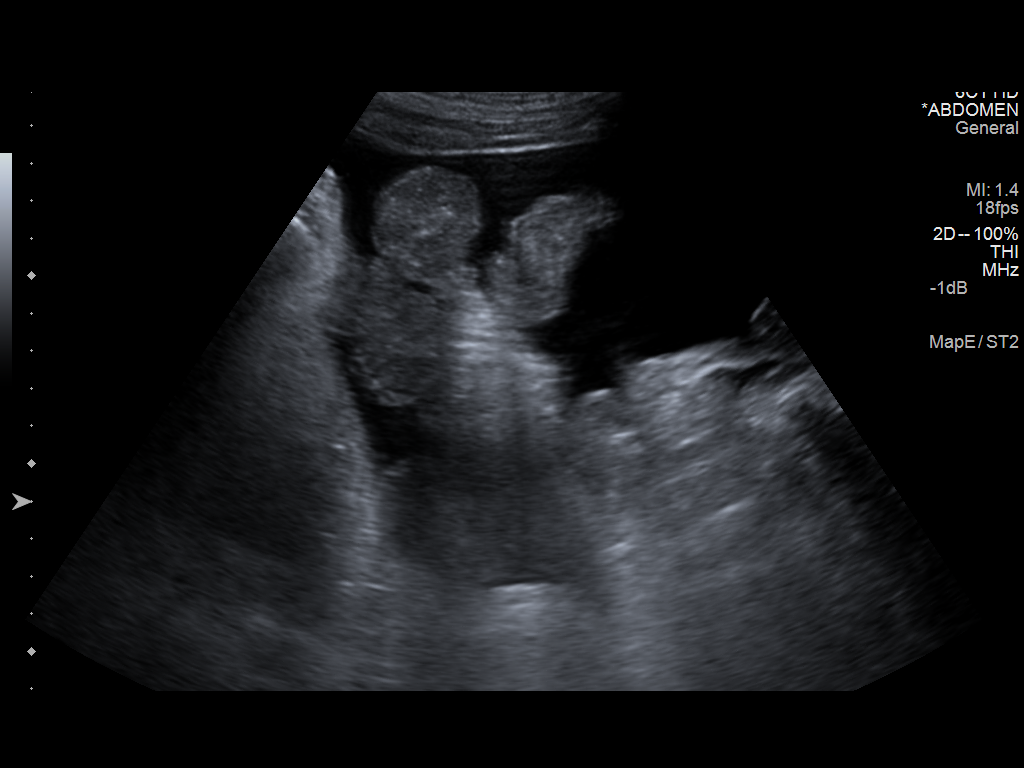
[im 6/6]
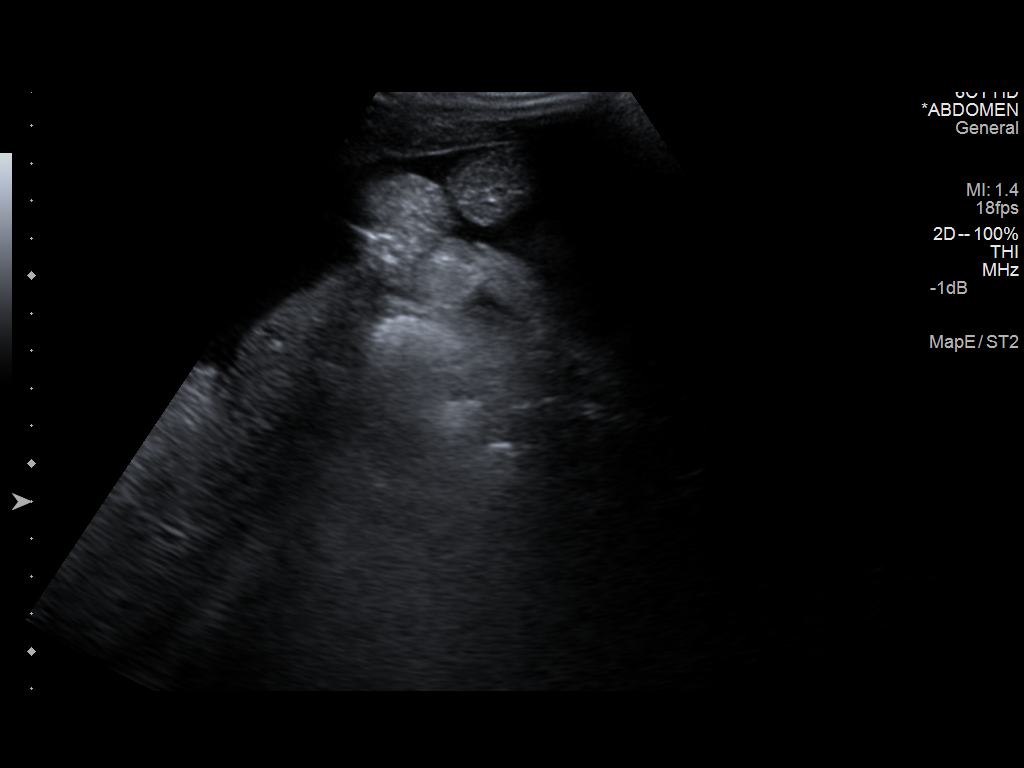

[6 of 6 positions shown; findings below may reference images not displayed]

IMPRESSION: Technically successful ultrasound guided paracentesis, removing 900
ml of ascites.
